# Patient Record
Sex: Male | Born: 1960
Health system: Southern US, Community
[De-identification: ages and names within clinical notes are randomized; demographics above are authoritative.]

## PROBLEM LIST (undated history)

## (undated) DIAGNOSIS — C801 Malignant (primary) neoplasm, unspecified: Secondary | ICD-10-CM

## (undated) DIAGNOSIS — J189 Pneumonia, unspecified organism: Secondary | ICD-10-CM

## (undated) DIAGNOSIS — K219 Gastro-esophageal reflux disease without esophagitis: Secondary | ICD-10-CM

## (undated) DIAGNOSIS — J449 Chronic obstructive pulmonary disease, unspecified: Secondary | ICD-10-CM

## (undated) DIAGNOSIS — E785 Hyperlipidemia, unspecified: Secondary | ICD-10-CM

## (undated) DIAGNOSIS — I10 Essential (primary) hypertension: Secondary | ICD-10-CM

## (undated) HISTORY — PX: NASAL SINUS SURGERY: SHX719

## (undated) HISTORY — DX: Chronic obstructive pulmonary disease, unspecified: J44.9

## (undated) HISTORY — DX: Hyperlipidemia, unspecified: E78.5

## (undated) HISTORY — PX: NASAL SEPTUM SURGERY: SHX37

---

## 2013-05-15 ENCOUNTER — Ambulatory Visit: Payer: Self-pay | Admitting: Family Medicine

## 2013-06-22 ENCOUNTER — Ambulatory Visit: Payer: Self-pay | Admitting: Otolaryngology

## 2013-08-24 DIAGNOSIS — J3489 Other specified disorders of nose and nasal sinuses: Secondary | ICD-10-CM | POA: Insufficient documentation

## 2013-08-24 DIAGNOSIS — J339 Nasal polyp, unspecified: Secondary | ICD-10-CM | POA: Insufficient documentation

## 2014-07-17 ENCOUNTER — Encounter: Payer: Self-pay | Admitting: Urgent Care

## 2014-07-17 ENCOUNTER — Emergency Department
Admission: EM | Admit: 2014-07-17 | Discharge: 2014-07-17 | Disposition: A | Discharge status: Home / Self Care | Payer: Managed Care, Other (non HMO) | Attending: Student | Admitting: Student

## 2014-07-17 DIAGNOSIS — Z720 Tobacco use: Secondary | ICD-10-CM | POA: Insufficient documentation

## 2014-07-17 DIAGNOSIS — Y9289 Other specified places as the place of occurrence of the external cause: Secondary | ICD-10-CM | POA: Insufficient documentation

## 2014-07-17 DIAGNOSIS — Y9389 Activity, other specified: Secondary | ICD-10-CM | POA: Insufficient documentation

## 2014-07-17 DIAGNOSIS — S61412A Laceration without foreign body of left hand, initial encounter: Secondary | ICD-10-CM | POA: Diagnosis not present

## 2014-07-17 DIAGNOSIS — W260XXA Contact with knife, initial encounter: Secondary | ICD-10-CM | POA: Insufficient documentation

## 2014-07-17 DIAGNOSIS — Y998 Other external cause status: Secondary | ICD-10-CM | POA: Insufficient documentation

## 2014-07-17 DIAGNOSIS — S61512A Laceration without foreign body of left wrist, initial encounter: Secondary | ICD-10-CM | POA: Diagnosis not present

## 2014-07-17 MED ORDER — LIDOCAINE-EPINEPHRINE (PF) 1 %-1:200000 IJ SOLN
INTRAMUSCULAR | Status: AC
  Filled 2014-07-17: qty 30

## 2014-07-17 MED ORDER — LIDOCAINE HCL (PF) 1 % IJ SOLN
INTRAMUSCULAR | Status: AC
  Filled 2014-07-17: qty 5

## 2014-07-17 MED ORDER — TETANUS-DIPHTH-ACELL PERTUSSIS 5-2.5-18.5 LF-MCG/0.5 IM SUSP
0.5000 mL | Freq: Once | INTRAMUSCULAR | Status: AC
  Administered 2014-07-17: 0.5 mL via INTRAMUSCULAR

## 2014-07-17 MED ORDER — IBUPROFEN 800 MG PO TABS: 800.0000 mg | ORAL_TABLET | Freq: Three times a day (TID) | ORAL | Status: DC | PRN

## 2014-07-17 MED ORDER — TETANUS-DIPHTH-ACELL PERTUSSIS 5-2.5-18.5 LF-MCG/0.5 IM SUSP
INTRAMUSCULAR | Status: AC
  Filled 2014-07-17: qty 0.5

## 2014-07-17 MED ORDER — HYDROCODONE-ACETAMINOPHEN 5-325 MG PO TABS: 1.0000 | ORAL_TABLET | ORAL | Status: DC | PRN

## 2014-07-17 NOTE — ED Notes (Signed)
Temporary pressure dressing applied in triage and secured with roll gauze. Wound is approximately 1" long and approximates well. (+) active bleeding noted.

## 2014-07-17 NOTE — ED Provider Notes (Signed)
[facility] Emergency Department Provider Note ____________________________________________  Time seen: Approximately 10:19 PM  I have reviewed the triage vital signs and the nursing notes.   HISTORY  Chief Complaint Laceration    HPI Kenneth Norman is a 54 y.o. male who presents to the emergency room for evaluation of laceration to his left hand and wrist. Patient states he was opening up some hamburger patties when the knife slipped and cut his hand. Tetanus unknown.   History reviewed. No pertinent past medical history.  There are no active problems to display for this patient.   Past Surgical History  Procedure Laterality Date  . Nasal sinus surgery      Current Outpatient Rx  Name  Route  Sig  Dispense  Refill  . HYDROcodone-acetaminophen (NORCO) 5-325 MG per tablet   Oral   Take 1-2 tablets by mouth every 4 (four) hours as needed for moderate pain.   10 tablet   0   . ibuprofen (ADVIL,MOTRIN) 800 MG tablet   Oral   Take 1 tablet (800 mg total) by mouth every 8 (eight) hours as needed.   30 tablet   0     Allergies Review of patient's allergies indicates no known allergies.  No family history on file.  Social History History  Substance Use Topics  . Smoking status: Current Every Day Smoker  . Smokeless tobacco: Not on file  . Alcohol Use: Yes    Review of Systems Constitutional: No fever/chills Eyes: No visual changes. ENT: No sore throat. Cardiovascular: Denies chest pain. Respiratory: Denies shortness of breath. Gastrointestinal: No abdominal pain.  No nausea, no vomiting.  No diarrhea.  No constipation. Genitourinary: Negative for dysuria. Musculoskeletal: Negative for back pain. Skin: Positive laceration to the left hand and wrist. Neurological: Negative for headaches, focal weakness or numbness.  10-point ROS otherwise negative.  ____________________________________________   PHYSICAL EXAM:  VITAL  SIGNS: ED Triage Vitals  Enc Vitals Group     BP 07/17/14 1948 120/85 mmHg     Pulse Rate 07/17/14 1948 80     Resp 07/17/14 1948 18     Temp 07/17/14 1948 98.6 F (37 C)     Temp Source 07/17/14 1948 Oral     SpO2 07/17/14 1948 98 %     Weight 07/17/14 1948 180 lb (81.647 kg)     Height 07/17/14 1948 5\' 10"  (1.778 m)     Head Cir --      Peak Flow --      Pain Score 07/17/14 1951 4     Pain Loc --      Pain Edu? --      Excl. in [place]? --     Constitutional: Alert and oriented. Well appearing and in no acute distress..   Cardiovascular: Normal rate, regular rhythm. Grossly normal heart sounds.  Good peripheral circulation. Respiratory: Normal respiratory effort.  No retractions. Lungs CTAB. Gastrointestinal: Soft and nontender. No distention. No abdominal bruits. No CVA tenderness. Musculoskeletal: No lower extremity tenderness nor edema.  No joint effusions. Neurologic:  Normal speech and language. No gross focal neurologic deficits are appreciated. Speech is normal. No gait instability. Skin:  3.0 cm laceration to left hand and 0.6 cm laceration to left wrist. No tendon involvement. Psychiatric: Mood and affect are normal. Speech and behavior are normal.  ____________________________________________   LABS (all labs ordered are listed, but only abnormal results are displayed)  Labs Reviewed - No data to display ____________________________________________  EKG  Deferred ____________________________________________  RADIOLOGY  Not applicable ____________________________________________   PROCEDURES  Procedure(s) performed: See Procedure note:  LACERATION REPAIR Performed by: Arlyss Repress Authorized by: Arlyss Repress Consent: Verbal consent obtained. Risks and benefits: risks, benefits and alternatives were discussed Consent given by: patient Patient identity confirmed: provided demographic data Prepped and Draped in normal sterile fashion Wound  explored  Laceration Location: Left hand/wrist  Laceration Length: 3.0cm and 0.5cm  No Foreign Bodies seen or palpated  Anesthesia: local infiltration  Local anesthetic: lidocaine 1% without epinephrine  Anesthetic total: 6 ml  Irrigation method: syringe Amount of cleaning: standard  Skin closure: 4-0 Nylon  Number of sutures: Hand: 5; Wrist:3  Technique: Simple interrupted  Patient tolerance: Patient tolerated the procedure well with no immediate complications.  Critical Care performed: No  ____________________________________________   INITIAL IMPRESSION / ASSESSMENT AND PLAN / ED COURSE  Pertinent labs & imaging results that were available during my care of the patient were reviewed by me and considered in my medical decision making (see chart for details).  Laceration to left hand and wrist. Procedure documented above. Wound was dressed and bandaged by myself after procedure. Distally neurovascularly intact. Tetanus updated. Patient to return for wound check in 2 days and suture removal in one week.  Patient denies any other emergency medical complaints at this visit and will follow-up or return with worsening symptomology or otherwise as directed. ____________________________________________   FINAL CLINICAL IMPRESSION(S) / ED DIAGNOSES  Final diagnoses:  Hand laceration, left, initial encounter      Arlyss Repress, PA-C 07/17/14 Rowlett Gayle, MD 07/18/14 [id number]

## 2014-07-17 NOTE — Discharge Instructions (Signed)
Laceration Care, Adult °A laceration is a cut or lesion that goes through all layers of the skin and into the tissue just beneath the skin. °TREATMENT  °Some lacerations may not require closure. Some lacerations may not be able to be closed due to an increased risk of infection. It is important to see your caregiver as soon as possible after an injury to minimize the risk of infection and maximize the opportunity for successful closure. °If closure is appropriate, pain medicines may be given, if needed. The wound will be cleaned to help prevent infection. Your caregiver will use stitches (sutures), staples, wound glue (adhesive), or skin adhesive strips to repair the laceration. These tools bring the skin edges together to allow for faster healing and a better cosmetic outcome. However, all wounds will heal with a scar. Once the wound has healed, scarring can be minimized by covering the wound with sunscreen during the day for 1 full year. °HOME CARE INSTRUCTIONS  °For sutures or staples: °· Keep the wound clean and dry. °· If you were given a bandage (dressing), you should change it at least once a day. Also, change the dressing if it becomes wet or dirty, or as directed by your caregiver. °· Wash the wound with soap and water 2 times a day. Rinse the wound off with water to remove all soap. Pat the wound dry with a clean towel. °· After cleaning, apply a thin layer of the antibiotic ointment as recommended by your caregiver. This will help prevent infection and keep the dressing from sticking. °· You may shower as usual after the first 24 hours. Do not soak the wound in water until the sutures are removed. °· Only take over-the-counter or prescription medicines for pain, discomfort, or fever as directed by your caregiver. °· Get your sutures or staples removed as directed by your caregiver. °For skin adhesive strips: °· Keep the wound clean and dry. °· Do not get the skin adhesive strips wet. You may bathe  carefully, using caution to keep the wound dry. °· If the wound gets wet, pat it dry with a clean towel. °· Skin adhesive strips will fall off on their own. You may trim the strips as the wound heals. Do not remove skin adhesive strips that are still stuck to the wound. They will fall off in time. °For wound adhesive: °· You may briefly wet your wound in the shower or bath. Do not soak or scrub the wound. Do not swim. Avoid periods of heavy perspiration until the skin adhesive has fallen off on its own. After showering or bathing, gently pat the wound dry with a clean towel. °· Do not apply liquid medicine, cream medicine, or ointment medicine to your wound while the skin adhesive is in place. This may loosen the film before your wound is healed. °· If a dressing is placed over the wound, be careful not to apply tape directly over the skin adhesive. This may cause the adhesive to be pulled off before the wound is healed. °· Avoid prolonged exposure to sunlight or tanning lamps while the skin adhesive is in place. Exposure to ultraviolet light in the first year will darken the scar. °· The skin adhesive will usually remain in place for 5 to 10 days, then naturally fall off the skin. Do not pick at the adhesive film. °You may need a tetanus shot if: °· You cannot remember when you had your last tetanus shot. °· You have never had a tetanus   shot. °If you get a tetanus shot, your arm may swell, get red, and feel warm to the touch. This is common and not a problem. If you need a tetanus shot and you choose not to have one, there is a rare chance of getting tetanus. Sickness from tetanus can be serious. °SEEK MEDICAL CARE IF:  °· You have redness, swelling, or increasing pain in the wound. °· You see a red line that goes away from the wound. °· You have yellowish-white fluid (pus) coming from the wound. °· You have a fever. °· You notice a bad smell coming from the wound or dressing. °· Your wound breaks open before or  after sutures have been removed. °· You notice something coming out of the wound such as wood or glass. °· Your wound is on your hand or foot and you cannot move a finger or toe. °SEEK IMMEDIATE MEDICAL CARE IF:  °· Your pain is not controlled with prescribed medicine. °· You have severe swelling around the wound causing pain and numbness or a change in color in your arm, hand, leg, or foot. °· Your wound splits open and starts bleeding. °· You have worsening numbness, weakness, or loss of function of any joint around or beyond the wound. °· You develop painful lumps near the wound or on the skin anywhere on your body. °MAKE SURE YOU:  °· Understand these instructions. °· Will watch your condition. °· Will get help right away if you are not doing well or get worse. °Document Released: 01/11/2005 Document Revised: 04/05/2011 Document Reviewed: 07/07/2010 °Exit[employer]® Patient Information ©2015 [employer]. This information is not intended to replace advice given to you by your health care provider. Make sure you discuss any questions you have with your health care provider. ° °Sutured Wound Care °Sutures are stitches that can be used to close wounds. Wound care helps prevent pain and infection.  °HOME CARE INSTRUCTIONS  °· Rest and elevate the injured area until all the pain and swelling are gone. °· Only take over-the-counter or prescription medicines for pain, discomfort, or fever as directed by your caregiver. °· After 48 hours, gently wash the area with mild soap and water once a day, or as directed. Rinse off the soap. Pat the area dry with a clean towel. Do not rub the wound. This may cause bleeding. °· Follow your caregiver's instructions for how often to change the bandage (dressing). Stop using a dressing after 2 days or after the wound stops draining. °· If the dressing sticks, moisten it with soapy water and gently remove it. °· Apply ointment on the wound as directed. °· Avoid stretching a sutured  wound. °· Drink enough fluids to keep your urine clear or pale yellow. °· Follow up with your caregiver for suture removal as directed. °· Use sunscreen on your wound for the next 3 to 6 months so the scar will not darken. °SEEK IMMEDIATE MEDICAL CARE IF:  °· Your wound becomes red, swollen, hot, or tender. °· You have increasing pain in the wound. °· You have a red streak that extends from the wound. °· There is pus coming from the wound. °· You have a fever. °· You have shaking chills. °· There is a bad smell coming from the wound. °· You have persistent bleeding from the wound. °MAKE SURE YOU:  °· Understand these instructions. °· Will watch your condition. °· Will get help right away if you are not doing well or get worse. °Document Released:   02/19/2004 Document Revised: 04/05/2011 Document Reviewed: 05/17/2010 °Exit[employer]® Patient Information ©2015 [employer]. This information is not intended to replace advice given to you by your health care provider. Make sure you discuss any questions you have with your health care provider. ° °

## 2014-07-17 NOTE — ED Notes (Signed)
First nurse note: pt arrived with laceration to left hand. Cut with "very sharp" knife while separating frozen meat.

## 2014-07-17 NOTE — ED Notes (Signed)
Patient presents with laceration to the palmar surface of his LEFT hand. Patient reporting that he was "separating two frozen hamburger patties and the knife slipped."

## 2014-07-19 ENCOUNTER — Encounter: Payer: Self-pay | Admitting: Medical Oncology

## 2014-07-19 ENCOUNTER — Emergency Department
Admission: EM | Admit: 2014-07-19 | Discharge: 2014-07-19 | Disposition: A | Discharge status: Home / Self Care | Payer: Managed Care, Other (non HMO) | Attending: Emergency Medicine | Admitting: Emergency Medicine

## 2014-07-19 DIAGNOSIS — Z4801 Encounter for change or removal of surgical wound dressing: Secondary | ICD-10-CM | POA: Insufficient documentation

## 2014-07-19 DIAGNOSIS — Z720 Tobacco use: Secondary | ICD-10-CM | POA: Diagnosis not present

## 2014-07-19 DIAGNOSIS — Z5189 Encounter for other specified aftercare: Secondary | ICD-10-CM

## 2014-07-19 NOTE — ED Notes (Signed)
MD at bedside. 

## 2014-07-19 NOTE — Discharge Instructions (Signed)

## 2014-07-19 NOTE — ED Notes (Signed)
Pt here for left hand re-check. Had sutures placed Thursday night.

## 2014-07-19 NOTE — ED Provider Notes (Signed)
[facility] Emergency Department Provider Note  ____________________________________________  Time seen: Approximately 10:52 AM  I have reviewed the triage vital signs and the nursing notes.   HISTORY  Chief Complaint Follow-up     HPI Kenneth Norman is a 54 y.o. male presents for today evaluation postop laceration. Patient denies any complaints at today's visit states that he is doing well. He's had one dressing change and denies any discharge or drainage. Denies any pain.   History reviewed. No pertinent past medical history.  There are no active problems to display for this patient.   Past Surgical History  Procedure Laterality Date  . Nasal sinus surgery      Current Outpatient Rx  Name  Route  Sig  Dispense  Refill  . HYDROcodone-acetaminophen (NORCO) 5-325 MG per tablet   Oral   Take 1-2 tablets by mouth every 4 (four) hours as needed for moderate pain.   10 tablet   0   . ibuprofen (ADVIL,MOTRIN) 800 MG tablet   Oral   Take 1 tablet (800 mg total) by mouth every 8 (eight) hours as needed.   30 tablet   0     Allergies Review of patient's allergies indicates no known allergies.  No family history on file.  Social History History  Substance Use Topics  . Smoking status: Current Every Day Smoker  . Smokeless tobacco: Not on file  . Alcohol Use: Yes    Review of Systems Constitutional: No fever/chills Eyes: No visual changes. ENT: No sore throat. Cardiovascular: Denies chest pain. Respiratory: Denies shortness of breath. Gastrointestinal: No abdominal pain.  No nausea, no vomiting.  No diarrhea.  No constipation. Genitourinary: Negative for dysuria. Musculoskeletal: Negative for back pain. Skin: Positive for laceration wound check. Neurological: Negative for headaches, focal weakness or numbness.  10-point ROS otherwise negative.  ____________________________________________   PHYSICAL EXAM:  VITAL SIGNS: ED  Triage Vitals  Enc Vitals Group     BP 07/19/14 1010 133/85 mmHg     Pulse Rate 07/19/14 1010 96     Resp 07/19/14 1010 18     Temp 07/19/14 1010 97.7 F (36.5 C)     Temp src --      SpO2 07/19/14 1010 97 %     Weight 07/19/14 1010 180 lb (81.647 kg)     Height 07/19/14 1010 5\' 10"  (1.778 m)     Head Cir --      Peak Flow --      Pain Score 07/19/14 1035 0     Pain Loc --      Pain Edu? --      Excl. in [place]? --     Constitutional: Alert and oriented. Well appearing and in no acute distress. Skin:  Laceration as well healing edges are approximated. No evidence of erythema or drainage. Psychiatric: Mood and affect are normal. Speech and behavior are normal.  ____________________________________________   LABS (all labs ordered are listed, but only abnormal results are displayed)  Labs Reviewed - No data to display ____________________________________________   PROCEDURES  Procedure(s) performed: None  Critical Care performed: No  ____________________________________________   INITIAL IMPRESSION / ASSESSMENT AND PLAN / ED COURSE  Pertinent labs & imaging results that were available during my care of the patient were reviewed by me and considered in my medical decision making (see chart for details).  48 hour wound check for laceration performed by myself. No erythema edema or drainage. Laceration is healing well. Patient  return to ER 1 week for suture removal.  Patient voices no other emergency medical complaints at this visit. ____________________________________________   FINAL CLINICAL IMPRESSION(S) / ED DIAGNOSES  Final diagnoses:  Visit for wound check      Arlyss Repress, PA-C 07/19/14 1057  Lavonia Drafts, MD 07/19/14 [phone]

## 2014-07-28 ENCOUNTER — Emergency Department
Admission: EM | Admit: 2014-07-28 | Discharge: 2014-07-28 | Disposition: A | Discharge status: Home / Self Care | Payer: Managed Care, Other (non HMO) | Attending: Emergency Medicine | Admitting: Emergency Medicine

## 2014-07-28 ENCOUNTER — Encounter: Payer: Self-pay | Admitting: Emergency Medicine

## 2014-07-28 DIAGNOSIS — Z4802 Encounter for removal of sutures: Secondary | ICD-10-CM | POA: Diagnosis not present

## 2014-07-28 DIAGNOSIS — Z720 Tobacco use: Secondary | ICD-10-CM | POA: Diagnosis not present

## 2014-07-28 MED ORDER — BACITRACIN ZINC 500 UNIT/GM EX OINT
TOPICAL_OINTMENT | CUTANEOUS | Status: AC
  Administered 2014-07-28: 1 via TOPICAL
  Filled 2014-07-28: qty 0.9

## 2014-07-28 MED ORDER — BACITRACIN 500 UNIT/GM EX OINT
1.0000 "application " | TOPICAL_OINTMENT | Freq: Two times a day (BID) | CUTANEOUS | Status: DC
  Administered 2014-07-28: 1 via TOPICAL

## 2014-07-28 NOTE — Discharge Instructions (Signed)
Keep clean and dry. Clean with soap and water. Apply topical antibiotic ointment daily such as neosporin. Keep covered at work.   Follow up with your primary care physician as needed.  Return to ER for new or worsening concerns.   Suture Removal, Care After Refer to this sheet in the next few weeks. These instructions provide you with information on caring for yourself after your procedure. Your health care provider may also give you more specific instructions. Your treatment has been planned according to current medical practices, but problems sometimes occur. Call your health care provider if you have any problems or questions after your procedure. WHAT TO EXPECT AFTER THE PROCEDURE After your stitches (sutures) are removed, it is typical to have the following:  Some discomfort and swelling in the wound area.  Slight redness in the area. HOME CARE INSTRUCTIONS   If you have skin adhesive strips over the wound area, do not take the strips off. They will fall off on their own in a few days. If the strips remain in place after 14 days, you may remove them.  Change any bandages (dressings) at least once a day or as directed by your health care provider. If the bandage sticks, soak it off with warm, soapy water.  Apply cream or ointment only as directed by your health care provider. If using cream or ointment, wash the area with soap and water 2 times a day to remove all the cream or ointment. Rinse off the soap and pat the area dry with a clean towel.  Keep the wound area dry and clean. If the bandage becomes wet or dirty, or if it develops a bad smell, change it as soon as possible.  Continue to protect the wound from injury.  Use sunscreen when out in the sun. New scars become sunburned easily. SEEK MEDICAL CARE IF:  You have increasing redness, swelling, or pain in the wound.  You see pus coming from the wound.  You have a fever.  You notice a bad smell coming from the wound or  dressing.  Your wound breaks open (edges not staying together). Document Released: 10/06/2000 Document Revised: 11/01/2012 Document Reviewed: 08/23/2012 [facility] Patient Information 2015 [place], [place]. This information is not intended to replace advice given to you by your health care provider. Make sure you discuss any questions you have with your health care provider.

## 2014-07-28 NOTE — ED Notes (Signed)
Bacitracin and bandaid applied as ordered

## 2014-07-28 NOTE — ED Notes (Signed)
9 sutures removed total - 6 in palm and 3 in wrist

## 2014-07-28 NOTE — ED Provider Notes (Signed)
[facility] Emergency Department Provider Note  ____________________________________________  Time seen: Approximately 12:25 PM  I have reviewed the triage vital signs and the nursing notes.   HISTORY  Chief Complaint Suture / Staple Removal   HPI ADYAN PALAU is a 54 y.o. male presents to the ER for complaint of suture removal. Patient reports that he was seen in the ER06/22/2016 post accidentally cutting his left hand and left wrist with a knife while cutting open a frozen hamburger patties. Patient reports that he is here for suture removal today. Denies complaints. Reports that healing well. Denies pain, fever, numbness or tingling, decreased sensation. 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 Again denies pain or other complaints.   History reviewed. No pertinent past medical history.  There are no active problems to display for this patient.   Past Surgical History  Procedure Laterality Date  . Nasal sinus surgery      Current Outpatient Rx  Name  Route  Sig  Dispense  Refill  . HYDROcodone-acetaminophen (NORCO) 5-325 MG per tablet   Oral   Take 1-2 tablets by mouth every 4 (four) hours as needed for moderate pain.   10 tablet   0   . ibuprofen (ADVIL,MOTRIN) 800 MG tablet   Oral   Take 1 tablet (800 mg total) by mouth every 8 (eight) hours as needed.   30 tablet   0     Allergies Review of patient's allergies indicates no known allergies.  No family history on file.  Social History History  Substance Use Topics  . Smoking status: Current Every Day Smoker  . Smokeless tobacco: Not on file  . Alcohol Use: Yes     Comment: occasional    Review of Systems Constitutional: No fever/chills Eyes: No visual changes. ENT: No sore throat. Cardiovascular: Denies chest pain. Respiratory: Denies shortness of breath. Gastrointestinal: No abdominal pain.  No nausea, no  vomiting.  No diarrhea.  No constipation. Genitourinary: Negative for dysuria. Musculoskeletal: Negative for back pain. Skin: Negative for rash. Neurological: Negative for headaches, focal weakness or numbness.  10-point ROS otherwise negative.  Reports tetanus up-to-date __________________________________________   PHYSICAL EXAM:  VITAL SIGNS: ED Triage Vitals  Enc Vitals Group     BP 07/28/14 1136 132/71 mmHg     Pulse Rate 07/28/14 1136 74     Resp 07/28/14 1136 16     Temp 07/28/14 1136 97.6 F (36.4 C)     Temp Source 07/28/14 1136 Oral     SpO2 07/28/14 1136 96 %     Weight 07/28/14 1136 180 lb (81.647 kg)     Height 07/28/14 1136 5\' 10"  (1.778 m)     Head Cir --      Peak Flow --      Pain Score --      Pain Loc --      Pain Edu? --      Excl. in [place]? --     Constitutional: Alert and oriented. Well appearing and in no acute distress. Eyes: Conjunctivae are normal. PERRL. EOMI. Head: Atraumatic. Nose: No congestion/rhinnorhea. Mouth/Throat: Mucous membranes are moist.   Neck: No stridor.  No cervical spine tenderness to palpation. Cardiovascular: Normal rate, regular rhythm. Grossly normal heart sounds.  Good peripheral circulation. Respiratory: Normal respiratory effort.  No retractions. Lungs CTAB. Gastrointestinal: Soft and nontender. No distention. Musculoskeletal: No lower extremity tenderness nor edema.  No joint  effusions. Neurologic:  Normal speech and language. No gross focal neurologic deficits are appreciated. Speech is normal. No gait instability. Skin:  Skin is warm, dry and intact. No rash noted.except: sutures removed by RN prior to examination. Healing lacerations to left pain and left wrist. Well approximated. No erythema, drainage, fluctuance.  Psychiatric: Mood and affect are normal. Speech and behavior are normal.  ____________________________________________ PROCEDURE: SUTURE REMOVAL Performed by: RN susan Consent: Verbal consent  obtained. Patient identity confirmed: provided demographic data Time out: Immediately prior to procedure a "time out" was called to verify the correct patient, procedure, equipment, support staff and site/side marked as required.  Location details: left hand  Wound Appearance: clean  Sutures/Staples Removed: 9  Facility: sutures placed in this facility Patient tolerance: Patient tolerated the procedure well with no immediate complications.    INITIAL IMPRESSION / ASSESSMENT AND PLAN / ED COURSE  Pertinent labs & imaging results that were available during my care of the patient were reviewed by me and considered in my medical decision making (see chart for details).  Patient presents for suture removal. The wound is well healed without signs of infection.  The sutures are removed. Wound care and activity instructions given. Return prn. ____________________________________________   FINAL CLINICAL IMPRESSION(S) / ED DIAGNOSES  Final diagnoses:  Visit for suture removal      Marylene Land, NP 07/28/14 [place], MD 07/28/14 1452

## 2014-07-28 NOTE — ED Notes (Signed)
Patient presents to have sutures removed from left hand. Denies any drainage from wounds

## 2015-03-13 DIAGNOSIS — Z720 Tobacco use: Secondary | ICD-10-CM | POA: Insufficient documentation

## 2015-05-26 DIAGNOSIS — I10 Essential (primary) hypertension: Secondary | ICD-10-CM | POA: Insufficient documentation

## 2015-10-10 IMAGING — CT CT PARANASAL SINUSES W/O CM
3 series · 15 of 47 positions shown, 18 images · non-contrast
Comparison: 05/15/2013 sinus CT.

CLINICAL DATA: Chronic sinusitis.  Presurgical exam.

EXAM:
CT PARANASAL SINUS WITHOUT CONTRAST
TECHNIQUE: Multidetector CT images of the paranasal sinuses were obtained using
the standard protocol without intravenous contrast.

[Series 3: ax soft · axial · 0.34mm/px · z∈[-104,+38]mm · 9 of 167 slices shown, 12 images]
[im 12/167  brain]
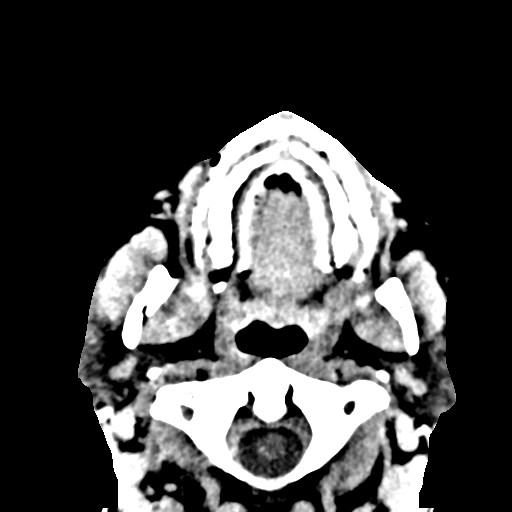
[im 12/167  bone]
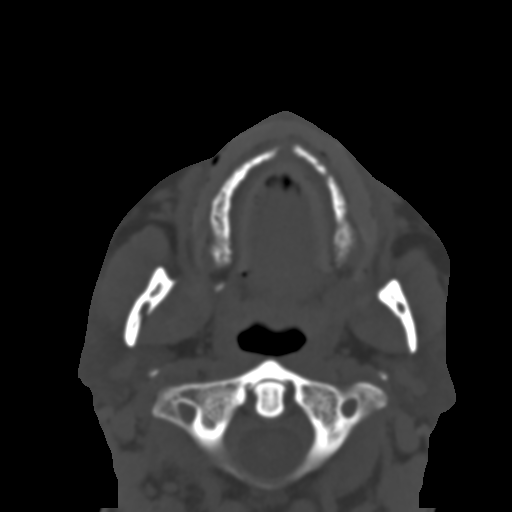
[im 29/167  bone]
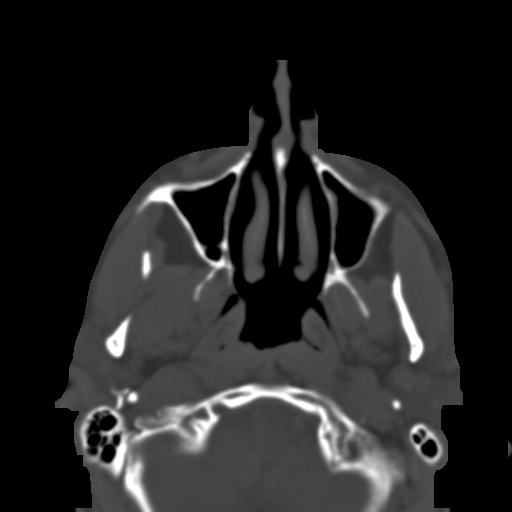
[im 46/167  bone]
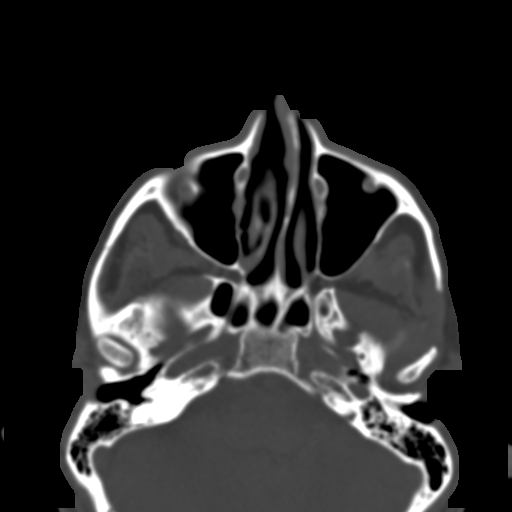
[im 63/167  bone]
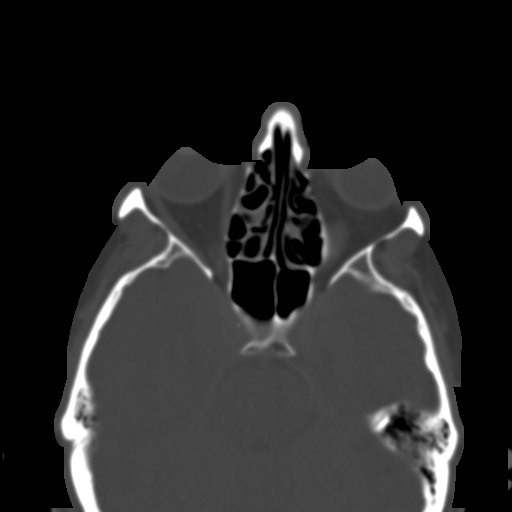
[im 86/167  brain]
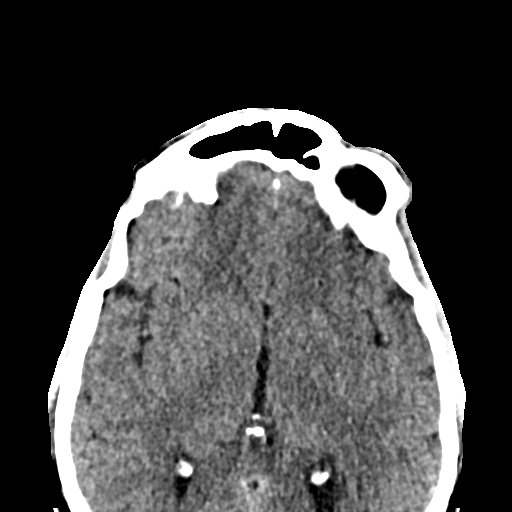
[im 86/167  bone]
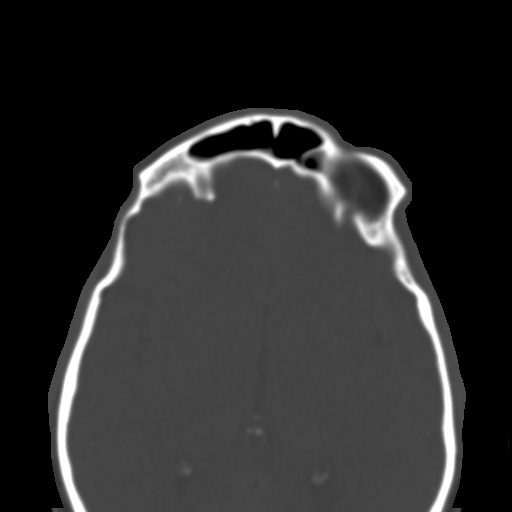
[im 104/167  bone]
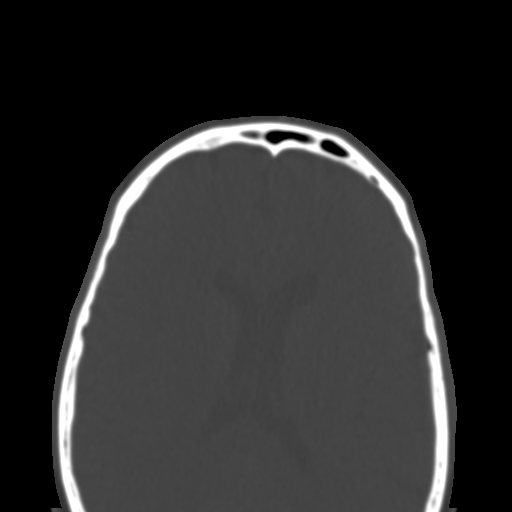
[im 121/167  bone]
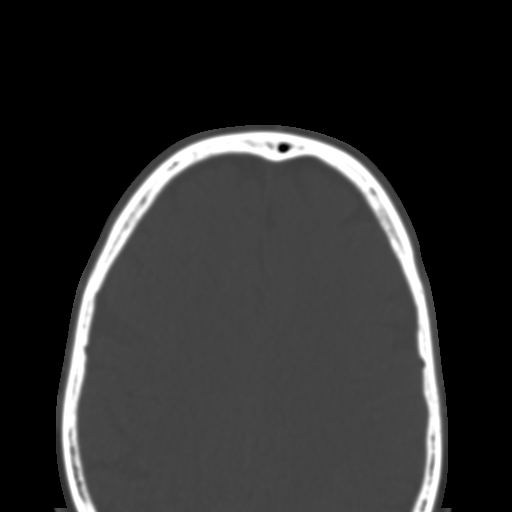
[im 138/167  bone]
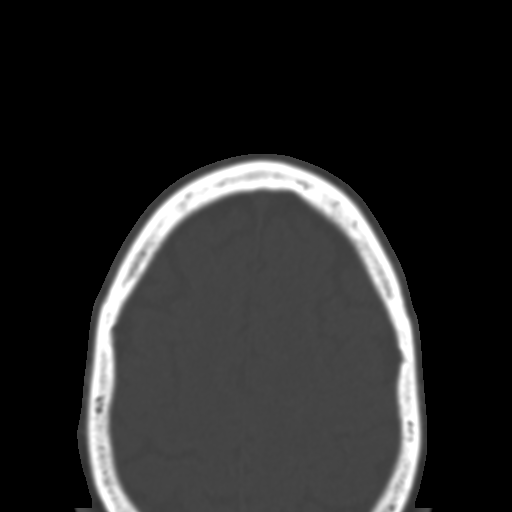
[im 155/167  brain]
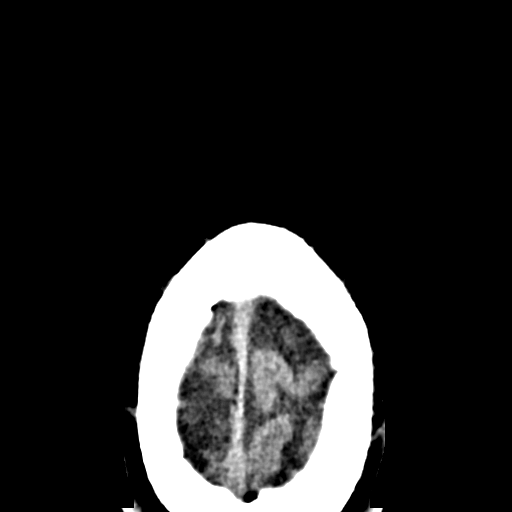
[im 155/167  bone]
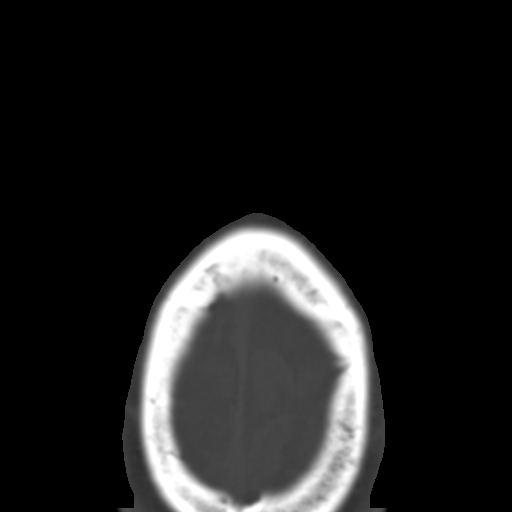

[Series 4: coronal bone · coronal · 0.34mm/px · 3 of 81 slices shown]
[im 27/81  bone]
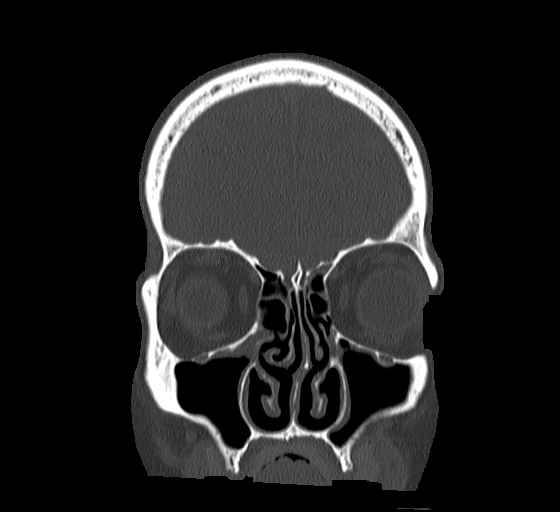
[im 36/81  bone]
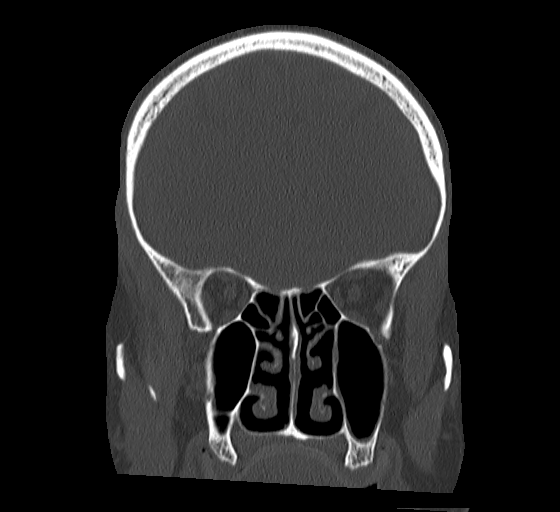
[im 45/81  bone]
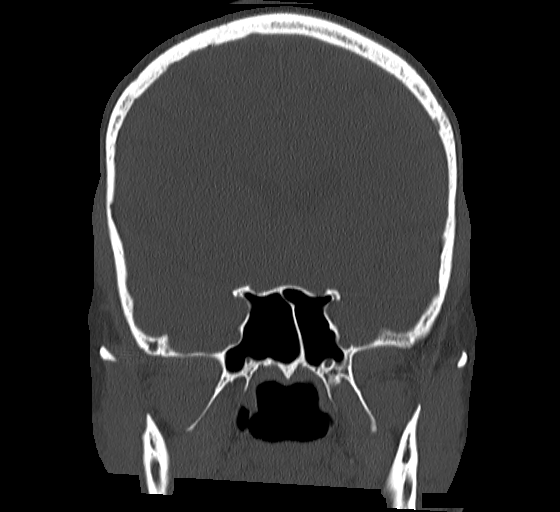

[Series 5: sagittal bone · sagittal · 0.34mm/px · 3 of 90 slices shown]
[im 30/90  bone]
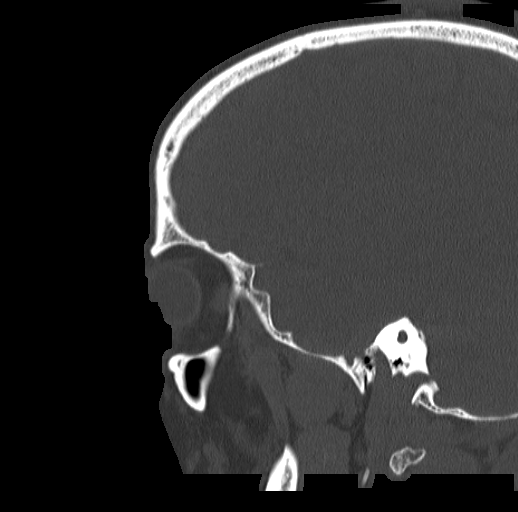
[im 45/90  bone]
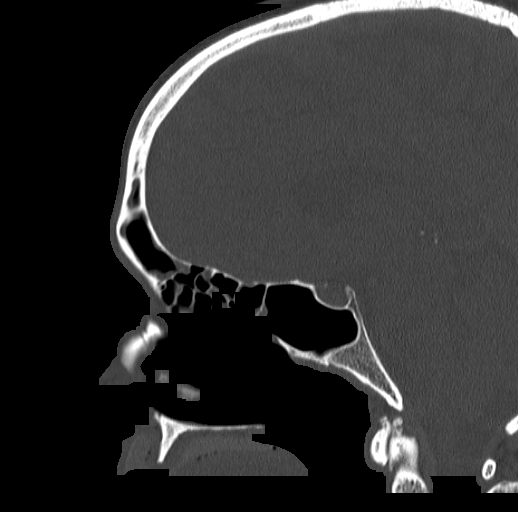
[im 60/90  bone]
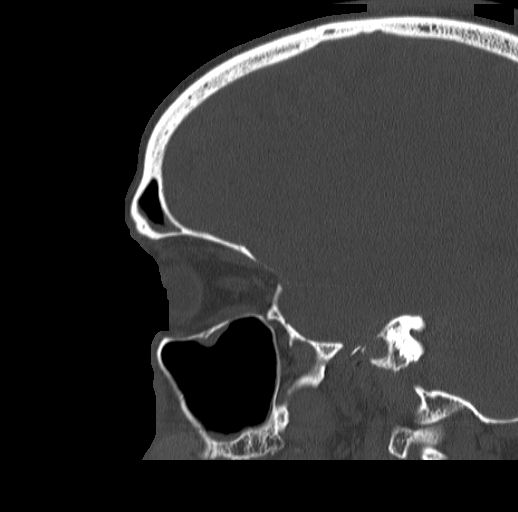

[15 of 47 positions shown; findings below may reference images not displayed]

FINDINGS: Minimal to mild mucosal thickening frontal sinuses more notable on
the left.

Minimal to mild mucosal thickening ethmoid sinus air cells improved
since prior exam.

Mild mucosal thickening maxillary sinuses most notable superior
aspect of the maxillary sinus adjacent to the infundibulum.
Infundibulum opacified bilaterally. Small left Haller cell
contributes to narrowing of the left infundibulum. Degree of
opacification of the right maxillary sinus has improved when
compared to prior exam.

Very minimal mucosal thickening anterior aspect sphenoid sinus air
cells. Right sphenoid sinus air cell is dominant. The septum between
the sphenoid sinus air cells is directed to the anterior aspect of
the left carotid canal. Partial aeration of the clinoid bilaterally.

Cribriform plate slightly lower on the right.

Nasal septum deviated slightly to left.

Mastoid air cells and middle ear cavities are clear bilaterally.

Calcifications internal carotid artery cavernous segment
bilaterally. Remainder of visualized intracranial structures and
orbital structures are unremarkable.

Left parotid sub cm lesion suggestive of a small lymph node.
IMPRESSION: Minimal to mild mucosal thickening frontal sinuses more notable on
the left.

Minimal to mild mucosal thickening ethmoid sinus air cells improved
since prior exam.

Mild mucosal thickening maxillary sinuses most notable superior
aspect of the maxillary sinus adjacent to the infundibulum.
Infundibulum opacified bilaterally. Small left Haller cell
contributes to narrowing of the left infundibulum. Degree of
opacification of the right maxillary sinus has improved when
compared to prior exam.

Very minimal mucosal thickening anterior aspect sphenoid sinus air
cells. Right sphenoid sinus air cell is dominant. The septum between
the sphenoid sinus air cells is directed to the anterior aspect of
the left carotid canal. Partial aeration of the clinoid bilaterally.

Cribriform plate slightly lower on the right.

Nasal septum deviated slightly to left.

## 2015-11-22 DIAGNOSIS — J449 Chronic obstructive pulmonary disease, unspecified: Secondary | ICD-10-CM | POA: Insufficient documentation

## 2016-08-02 DIAGNOSIS — E1169 Type 2 diabetes mellitus with other specified complication: Secondary | ICD-10-CM | POA: Insufficient documentation

## 2016-08-02 DIAGNOSIS — E782 Mixed hyperlipidemia: Secondary | ICD-10-CM | POA: Insufficient documentation

## 2017-11-16 DIAGNOSIS — Z122 Encounter for screening for malignant neoplasm of respiratory organs: Secondary | ICD-10-CM | POA: Insufficient documentation

## 2018-08-08 ENCOUNTER — Other Ambulatory Visit: Payer: Self-pay

## 2018-08-08 ENCOUNTER — Encounter: Payer: Self-pay | Admitting: Adult Health

## 2018-08-08 ENCOUNTER — Ambulatory Visit: Payer: Commercial Managed Care - PPO | Admitting: Adult Health

## 2018-08-08 VITALS — BP 142/89 | HR 88 | Resp 16 | Ht 70.0 in | Wt 207.0 lb

## 2018-08-08 DIAGNOSIS — J309 Allergic rhinitis, unspecified: Secondary | ICD-10-CM | POA: Insufficient documentation

## 2018-08-08 DIAGNOSIS — E782 Mixed hyperlipidemia: Secondary | ICD-10-CM | POA: Diagnosis not present

## 2018-08-08 DIAGNOSIS — R635 Abnormal weight gain: Secondary | ICD-10-CM

## 2018-08-08 DIAGNOSIS — I10 Essential (primary) hypertension: Secondary | ICD-10-CM | POA: Diagnosis not present

## 2018-08-08 DIAGNOSIS — K4090 Unilateral inguinal hernia, without obstruction or gangrene, not specified as recurrent: Secondary | ICD-10-CM

## 2018-08-08 DIAGNOSIS — J438 Other emphysema: Secondary | ICD-10-CM

## 2018-08-08 MED ORDER — AMLODIPINE BESYLATE 5 MG PO TABS: 5.0000 mg | ORAL_TABLET | Freq: Every day | ORAL | 0 refills | Status: DC

## 2018-08-08 NOTE — Progress Notes (Signed)
[facility] [place], [place] [address]  Internal MEDICINE  Office Visit Note  Patient Name: Kenneth Norman  [id number]  [id number]  Date of Service: 08/24/2018   Complaints/HPI Pt is here for establishment of PCP. Chief Complaint  Patient presents with  . Medical Management of Chronic Issues    new patient establish care  . Hyperlipidemia  . COPD   HPI Pt is here to establish care. He is a well appearing 58 yo man.  He reports a history of HLD, and COPD. He states that his previous PCP informed him his blood pressure was elevated, but he did not follow up on it for leaving.  His bp is elevated today 142/89, he Denies Chest pain, Shortness of breath, palpitations, headache, or blurred vision. He reports smoking 1.5 PPD of cigarettes, drinking 1-2 alcoholic beverages daily, and denies illicit drug use. He also has a left inguinal hernia that has been present for awhile.  He denies any significant discomfort or issues.        Current Medication: Outpatient Encounter Medications as of 08/08/2018  Medication Sig  . atorvastatin (LIPITOR) 20 MG tablet Take 20 mg by mouth daily.  Marland Kitchen umeclidinium-vilanterol (ANORO ELLIPTA) 62.5-25 MCG/INH AEPB Inhale 1 puff into the lungs daily.  Marland Kitchen amLODipine (NORVASC) 5 MG tablet Take 1 tablet (5 mg total) by mouth daily.  . [DISCONTINUED] HYDROcodone-acetaminophen (NORCO) 5-325 MG per tablet Take 1-2 tablets by mouth every 4 (four) hours as needed for moderate pain.  . [DISCONTINUED] ibuprofen (ADVIL,MOTRIN) 800 MG tablet Take 1 tablet (800 mg total) by mouth every 8 (eight) hours as needed.   No facility-administered encounter medications on file as of 08/08/2018.     Surgical History: Past Surgical History:  Procedure Laterality Date  . NASAL SEPTUM SURGERY    . NASAL SINUS SURGERY      Medical History: Past Medical History:  Diagnosis Date  . COPD (chronic obstructive pulmonary disease) ([place])   . Hyperlipidemia      Family History: Family History  Problem Relation Age of Onset  . Cancer Mother   . Heart disease Father     Social History   Socioeconomic History  . Marital status: Married    Spouse name: Not on file  . Number of children: Not on file  . Years of education: Not on file  . Highest education level: Not on file  Occupational History  . Not on file  Social Needs  . Financial resource strain: Not on file  . Food insecurity    Worry: Not on file    Inability: Not on file  . Transportation needs    Medical: Not on file    Non-medical: Not on file  Tobacco Use  . Smoking status: Current Every Day Smoker  . Smokeless tobacco: Never Used  Substance and Sexual Activity  . Alcohol use: Yes    Comment: occasional  . Drug use: Never  . Sexual activity: Not on file  Lifestyle  . Physical activity    Days per week: Not on file    Minutes per session: Not on file  . Stress: Not on file  Relationships  . Social Herbalist on phone: Not on file    Gets together: Not on file    Attends religious service: Not on file    Active member of club or organization: Not on file    Attends meetings of clubs or organizations: Not on file  Relationship status: Not on file  . Intimate partner violence    Fear of current or ex partner: Not on file    Emotionally abused: Not on file    Physically abused: Not on file    Forced sexual activity: Not on file  Other Topics Concern  . Not on file  Social History Narrative  . Not on file     Review of Systems  Constitutional: Negative.  Negative for chills, fatigue and unexpected weight change.  HENT: Positive for facial swelling and postnasal drip. Negative for congestion, rhinorrhea, sneezing and sore throat.   Eyes: Negative for redness.  Respiratory: Negative.  Negative for cough, chest tightness and shortness of breath.   Cardiovascular: Negative.  Negative for chest pain and palpitations.  Gastrointestinal: Negative.   Negative for abdominal pain, constipation, diarrhea, nausea and vomiting.  Endocrine: Negative.   Genitourinary: Negative.  Negative for dysuria and frequency.  Musculoskeletal: Negative.  Negative for arthralgias, back pain, joint swelling and neck pain.  Skin: Negative.  Negative for rash.  Allergic/Immunologic: Negative.   Neurological: Negative.  Negative for tremors and numbness.  Hematological: Negative for adenopathy. Does not bruise/bleed easily.  Psychiatric/Behavioral: Negative.  Negative for behavioral problems, sleep disturbance and suicidal ideas. The patient is not nervous/anxious.     Vital Signs: BP (!) 142/89 (BP Location: Right Arm, Patient Position: Sitting, Cuff Size: Normal)   Pulse 88   Resp 16   Ht 5\' 10"  (1.778 m)   Wt 207 lb (93.9 kg)   SpO2 97%   BMI 29.70 kg/m    Physical Exam Vitals signs and nursing note reviewed.  Constitutional:      General: He is not in acute distress.    Appearance: He is well-developed. He is not diaphoretic.  HENT:     Head: Normocephalic and atraumatic.     Mouth/Throat:     Pharynx: No oropharyngeal exudate.  Eyes:     Pupils: Pupils are equal, round, and reactive to light.  Neck:     Musculoskeletal: Normal range of motion and neck supple.     Thyroid: No thyromegaly.     Vascular: No JVD.     Trachea: No tracheal deviation.  Cardiovascular:     Rate and Rhythm: Normal rate and regular rhythm.     Heart sounds: Normal heart sounds. No murmur. No friction rub. No gallop.   Pulmonary:     Effort: Pulmonary effort is normal. No respiratory distress.     Breath sounds: Normal breath sounds. No wheezing or rales.  Chest:     Chest wall: No tenderness.  Abdominal:     Palpations: Abdomen is soft.     Tenderness: There is no abdominal tenderness. There is no guarding.  Musculoskeletal: Normal range of motion.  Lymphadenopathy:     Cervical: No cervical adenopathy.  Skin:    General: Skin is warm and dry.   Neurological:     Mental Status: He is alert and oriented to person, place, and time.     Cranial Nerves: No cranial nerve deficit.  Psychiatric:        Behavior: Behavior normal.        Thought Content: Thought content normal.        Judgment: Judgment normal.    Assessment/Plan: 1. Other emphysema ([place]) Continue Anoro at this time.  Will discuss establishing care with pulmonary at next visit.  - umeclidinium-vilanterol (ANORO ELLIPTA) 62.5-25 MCG/INH AEPB; Inhale 1 puff into the lungs daily.  2. Essential hypertension Take low dose Norvasc as discussed.   - amLODipine (NORVASC) 5 MG tablet; Take 1 tablet (5 mg total) by mouth daily.  Dispense: 30 tablet; Refill: 0  3. Mixed hyperlipidemia - atorvastatin (LIPITOR) 20 MG tablet; Take 20 mg by mouth daily.  4. Inguinal hernia of left side without obstruction or gangrene Offered surgical consult, pt declined at this time and will think about it in the future. We discussed the possibility of the hernia becoming incarcerated and he verbalized understanding.   5. Weight gain - TSH + free T4  General Counseling: Kmarion verbalizes understanding of the findings of todays visit and agrees with plan of treatment. I have discussed any further diagnostic evaluation that may be needed or ordered today. We also reviewed his medications today. he has been encouraged to call the office with any questions or concerns that should arise related to todays visit.  Orders Placed This Encounter  Procedures  . TSH + free T4    Meds ordered this encounter  Medications  . amLODipine (NORVASC) 5 MG tablet    Sig: Take 1 tablet (5 mg total) by mouth daily.    Dispense:  30 tablet    Refill:  0    Time spent: 35 Minutes   This patient was seen by Orson Gear AGNP-C in Collaboration with Dr Lavera Guise as a part of collaborative care agreement  Kendell Bane AGNP-C Internal Medicine

## 2018-08-09 LAB — TSH+FREE T4
Free T4: 1.14 ng/dL (ref 0.82–1.77)
TSH: 2.44 u[IU]/mL (ref 0.450–4.500)

## 2018-08-31 ENCOUNTER — Encounter: Payer: Self-pay | Admitting: Adult Health

## 2018-09-04 ENCOUNTER — Other Ambulatory Visit: Payer: Self-pay

## 2018-09-04 ENCOUNTER — Ambulatory Visit: Payer: Commercial Managed Care - PPO | Admitting: Adult Health

## 2018-09-04 VITALS — Temp 100.0°F

## 2018-09-04 DIAGNOSIS — Z20828 Contact with and (suspected) exposure to other viral communicable diseases: Secondary | ICD-10-CM

## 2018-09-04 DIAGNOSIS — Z20822 Contact with and (suspected) exposure to covid-19: Secondary | ICD-10-CM

## 2018-09-04 DIAGNOSIS — I10 Essential (primary) hypertension: Secondary | ICD-10-CM

## 2018-09-04 NOTE — Progress Notes (Signed)
[facility] [place],  [address]  Internal MEDICINE  Telephone Visit  Patient Name: Kenneth Norman  [id number]  [id number]  Date of Service: 09/04/2018  I connected with the patient at [phone] by telephone and verified the patients identity using two identifiers.   I discussed the limitations, risks, security and privacy concerns of performing an evaluation and management service by telephone and the availability of in person appointments. I also discussed with the patient that there may be a patient responsible charge related to the service.  The patient expressed understanding and agrees to proceed.    Chief Complaint  Patient presents with  . Telephone Screen  . Fever  . Chills  . Migraine  . Cough  . Telephone Assessment    HPI  Pt reports yesterday he began to notice back and knee pains.  This morning he woke up with a fever 100.0, a worse than normal smokers cough, chills and headache.  He works for [employer], and was told he had to be tested to return to work.  He is currently taking Dayquil, and is feeling a little better.    Current Medication: Outpatient Encounter Medications as of 09/04/2018  Medication Sig  . amLODipine (NORVASC) 5 MG tablet Take 1 tablet (5 mg total) by mouth daily.  Marland Kitchen atorvastatin (LIPITOR) 20 MG tablet Take 20 mg by mouth daily.  Marland Kitchen umeclidinium-vilanterol (ANORO ELLIPTA) 62.5-25 MCG/INH AEPB Inhale 1 puff into the lungs daily.   No facility-administered encounter medications on file as of 09/04/2018.     Surgical History: Past Surgical History:  Procedure Laterality Date  . NASAL SEPTUM SURGERY    . NASAL SINUS SURGERY      Medical History: Past Medical History:  Diagnosis Date  . COPD (chronic obstructive pulmonary disease) ([place])   . Hyperlipidemia     Family History: Family History  Problem Relation Age of Onset  . Cancer Mother   . Heart disease Father     Social History   Socioeconomic  History  . Marital status: Married    Spouse name: Not on file  . Number of children: Not on file  . Years of education: Not on file  . Highest education level: Not on file  Occupational History  . Not on file  Social Needs  . Financial resource strain: Not on file  . Food insecurity    Worry: Not on file    Inability: Not on file  . Transportation needs    Medical: Not on file    Non-medical: Not on file  Tobacco Use  . Smoking status: Current Every Day Smoker  . Smokeless tobacco: Never Used  Substance and Sexual Activity  . Alcohol use: Yes    Comment: occasional  . Drug use: Never  . Sexual activity: Not on file  Lifestyle  . Physical activity    Days per week: Not on file    Minutes per session: Not on file  . Stress: Not on file  Relationships  . Social Herbalist on phone: Not on file    Gets together: Not on file    Attends religious service: Not on file    Active member of club or organization: Not on file    Attends meetings of clubs or organizations: Not on file    Relationship status: Not on file  . Intimate partner violence    Fear of current or ex partner: Not on file  Emotionally abused: Not on file    Physically abused: Not on file    Forced sexual activity: Not on file  Other Topics Concern  . Not on file  Social History Narrative  . Not on file      Review of Systems  Constitutional: Negative.  Negative for chills, fatigue and unexpected weight change.  HENT: Negative.  Negative for congestion, rhinorrhea, sneezing and sore throat.   Eyes: Negative for redness.  Respiratory: Negative.  Negative for cough, chest tightness and shortness of breath.   Cardiovascular: Negative.  Negative for chest pain and palpitations.  Gastrointestinal: Negative.  Negative for abdominal pain, constipation, diarrhea, nausea and vomiting.  Endocrine: Negative.   Genitourinary: Negative.  Negative for dysuria and frequency.  Musculoskeletal: Negative.   Negative for arthralgias, back pain, joint swelling and neck pain.  Skin: Negative.  Negative for rash.  Allergic/Immunologic: Negative.   Neurological: Negative.  Negative for tremors and numbness.  Hematological: Negative for adenopathy. Does not bruise/bleed easily.  Psychiatric/Behavioral: Negative.  Negative for behavioral problems, sleep disturbance and suicidal ideas. The patient is not nervous/anxious.     Vital Signs: Temp 100 F (37.8 C)    Observation/Objective:  Well sounding NAD noted   Assessment/Plan: 1. Exposure to 2019 Novel Coronavirus Will get Covid-19 testing, and follow up with patient when results are available.   2. Essential hypertension Stable, continue present management.   General Counseling: Kenneth Norman verbalizes understanding of the findings of today's phone visit and agrees with plan of treatment. I have discussed any further diagnostic evaluation that may be needed or ordered today. We also reviewed his medications today. he has been encouraged to call the office with any questions or concerns that should arise related to todays visit.    No orders of the defined types were placed in this encounter.   No orders of the defined types were placed in this encounter.   Time spent: [place] AGNP-C Internal medicine

## 2018-09-05 ENCOUNTER — Encounter (INDEPENDENT_AMBULATORY_CARE_PROVIDER_SITE_OTHER): Payer: Self-pay

## 2018-09-05 LAB — NOVEL CORONAVIRUS, NAA: SARS-CoV-2, NAA: NOT DETECTED

## 2018-09-06 ENCOUNTER — Ambulatory Visit
Admission: RE | Admit: 2018-09-06 | Discharge: 2018-09-06 | Disposition: A | Discharge status: Home / Self Care | Payer: Commercial Managed Care - PPO | Attending: Adult Health | Admitting: Adult Health

## 2018-09-06 ENCOUNTER — Other Ambulatory Visit: Payer: Self-pay

## 2018-09-06 ENCOUNTER — Encounter: Payer: Self-pay | Admitting: Adult Health

## 2018-09-06 ENCOUNTER — Ambulatory Visit: Payer: Commercial Managed Care - PPO | Admitting: Adult Health

## 2018-09-06 ENCOUNTER — Telehealth: Payer: Self-pay

## 2018-09-06 ENCOUNTER — Ambulatory Visit
Admission: RE | Admit: 2018-09-06 | Discharge: 2018-09-06 | Disposition: A | Discharge status: Home / Self Care | Payer: Commercial Managed Care - PPO | Source: Ambulatory Visit | Attending: Adult Health | Admitting: Adult Health

## 2018-09-06 VITALS — BP 144/82 | HR 97 | Temp 99.5°F | Ht 70.0 in | Wt 198.0 lb

## 2018-09-06 DIAGNOSIS — R059 Cough, unspecified: Secondary | ICD-10-CM

## 2018-09-06 DIAGNOSIS — J988 Other specified respiratory disorders: Secondary | ICD-10-CM

## 2018-09-06 DIAGNOSIS — I10 Essential (primary) hypertension: Secondary | ICD-10-CM | POA: Diagnosis not present

## 2018-09-06 DIAGNOSIS — R05 Cough: Secondary | ICD-10-CM

## 2018-09-06 MED ORDER — AZITHROMYCIN 250 MG PO TABS: ORAL_TABLET | ORAL | 0 refills | Status: DC

## 2018-09-06 MED ORDER — GUAIFENESIN-CODEINE 100-10 MG/5ML PO SYRP: 5.0000 mL | ORAL_SOLUTION | Freq: Every evening | ORAL | 0 refills | Status: DC | PRN

## 2018-09-06 NOTE — Progress Notes (Signed)
[facility] [place],  [address]  Internal MEDICINE  Telephone Visit  Patient Name: Kenneth Norman  [id number]  [id number]  Date of Service: 09/06/2018  I connected with the patient at 845 by telephone and verified the patients identity using two identifiers.   I discussed the limitations, risks, security and privacy concerns of performing an evaluation and management service by telephone and the availability of in person appointments. I also discussed with the patient that there may be a patient responsible charge related to the service.  The patient expressed understanding and agrees to proceed.    Chief Complaint  Patient presents with  . Telephone Assessment  . Telephone Screen  . Hypertension  . Sore Throat  . Cough  . Fatigue  . Shortness of Breath  . Results    negative for COVID    HPI  PT seen via video.  He reports he has continued productive cough.  His joint pain in his knees has improved with aleve.  He also reports his fever seems to have gotten under control with the aleve as well.  His Covid test was negative. His blood pressure is slightly elevated today, Denies Chest pain, Shortness of breath, palpitations, headache, or blurred vision.    Current Medication: Outpatient Encounter Medications as of 09/06/2018  Medication Sig  . amLODipine (NORVASC) 5 MG tablet Take 1 tablet (5 mg total) by mouth daily.  Marland Kitchen atorvastatin (LIPITOR) 20 MG tablet Take 20 mg by mouth daily.  Marland Kitchen umeclidinium-vilanterol (ANORO ELLIPTA) 62.5-25 MCG/INH AEPB Inhale 1 puff into the lungs daily.  Marland Kitchen azithromycin (ZITHROMAX) 250 MG tablet Take as directed  . guaiFENesin-codeine (ROBITUSSIN AC) 100-10 MG/5ML syrup Take 5 mLs by mouth at bedtime as needed for cough.   No facility-administered encounter medications on file as of 09/06/2018.     Surgical History: Past Surgical History:  Procedure Laterality Date  . NASAL SEPTUM SURGERY    . NASAL SINUS SURGERY       Medical History: Past Medical History:  Diagnosis Date  . COPD (chronic obstructive pulmonary disease) ([place])   . Hyperlipidemia     Family History: Family History  Problem Relation Age of Onset  . Cancer Mother   . Heart disease Father     Social History   Socioeconomic History  . Marital status: Married    Spouse name: Not on file  . Number of children: Not on file  . Years of education: Not on file  . Highest education level: Not on file  Occupational History  . Not on file  Social Needs  . Financial resource strain: Not on file  . Food insecurity    Worry: Not on file    Inability: Not on file  . Transportation needs    Medical: Not on file    Non-medical: Not on file  Tobacco Use  . Smoking status: Current Every Day Smoker  . Smokeless tobacco: Never Used  Substance and Sexual Activity  . Alcohol use: Yes    Comment: occasional  . Drug use: Never  . Sexual activity: Not on file  Lifestyle  . Physical activity    Days per week: Not on file    Minutes per session: Not on file  . Stress: Not on file  Relationships  . Social Herbalist on phone: Not on file    Gets together: Not on file    Attends religious service: Not on file  Active member of club or organization: Not on file    Attends meetings of clubs or organizations: Not on file    Relationship status: Not on file  . Intimate partner violence    Fear of current or ex partner: Not on file    Emotionally abused: Not on file    Physically abused: Not on file    Forced sexual activity: Not on file  Other Topics Concern  . Not on file  Social History Narrative  . Not on file      Review of Systems  Constitutional: Negative.  Negative for chills, fatigue and unexpected weight change.  HENT: Negative.  Negative for congestion, rhinorrhea, sneezing and sore throat.   Eyes: Negative for redness.  Respiratory: Negative.  Negative for cough, chest tightness and shortness of breath.    Cardiovascular: Negative.  Negative for chest pain and palpitations.  Gastrointestinal: Negative.  Negative for abdominal pain, constipation, diarrhea, nausea and vomiting.  Endocrine: Negative.   Genitourinary: Negative.  Negative for dysuria and frequency.  Musculoskeletal: Negative.  Negative for arthralgias, back pain, joint swelling and neck pain.  Skin: Negative.  Negative for rash.  Allergic/Immunologic: Negative.   Neurological: Negative.  Negative for tremors and numbness.  Hematological: Negative for adenopathy. Does not bruise/bleed easily.  Psychiatric/Behavioral: Negative.  Negative for behavioral problems, sleep disturbance and suicidal ideas. The patient is not nervous/anxious.     Vital Signs: BP (!) 144/82   Pulse 97   Temp 99.5 F (37.5 C)   Ht 5\' 10"  (1.778 m)   Wt 198 lb (89.8 kg)   BMI 28.41 kg/m    Observation/Objective:  Well appearing, NAD noted at this time.    Assessment/Plan: 1. Respiratory infection Advised patient to take entire course of antibiotics as prescribed with food. Pt should return to clinic in 7-10 days if symptoms fail to improve or new symptoms develop.  - azithromycin (ZITHROMAX) 250 MG tablet; Take as directed  Dispense: 6 tablet; Refill: 0  2. Cough Reviewed risks and possible side effects associated with taking opiates, benzodiazepines and other CNS depressants. Combination of these could cause dizziness and drowsiness. Advised patient not to drive or operate machinery when taking these medications, as patient's and other's life can be at risk and will have consequences. Patient verbalized understanding in this matter. Dependence and abuse for these drugs will be monitored closely. A Controlled substance policy and procedure is on file which allows [facility] to order a urine drug screen test at any visit. Patient understands and agrees with the plan - DG Chest 2 View; Future - guaiFENesin-codeine (ROBITUSSIN AC) 100-10  MG/5ML syrup; Take 5 mLs by mouth at bedtime as needed for cough.  Dispense: 118 mL; Refill: 0  3. Essential hypertension Will continue to monitor pressure at future visits.   General Counseling: Bayley verbalizes understanding of the findings of today's phone visit and agrees with plan of treatment. I have discussed any further diagnostic evaluation that may be needed or ordered today. We also reviewed his medications today. he has been encouraged to call the office with any questions or concerns that should arise related to todays visit.    Orders Placed This Encounter  Procedures  . DG Chest 2 View    Meds ordered this encounter  Medications  . azithromycin (ZITHROMAX) 250 MG tablet    Sig: Take as directed    Dispense:  6 tablet    Refill:  0  . guaiFENesin-codeine (ROBITUSSIN AC) 100-10  MG/5ML syrup    Sig: Take 5 mLs by mouth at bedtime as needed for cough.    Dispense:  118 mL    Refill:  0    Time spent: [place] AGNP-C Internal medicine

## 2018-09-06 NOTE — Telephone Encounter (Signed)
He has pneumonia on his CXR, he should take his azithromycin, and I want him to get a repeat CXR in 4 weeks to make sure it cleared up.

## 2018-09-07 NOTE — Telephone Encounter (Signed)
Should have a follow up

## 2018-09-08 ENCOUNTER — Telehealth: Payer: Self-pay | Admitting: Adult Health

## 2018-09-08 ENCOUNTER — Other Ambulatory Visit: Payer: Self-pay | Admitting: Adult Health

## 2018-09-08 MED ORDER — PREDNISONE 10 MG (21) PO TBPK: ORAL_TABLET | ORAL | 0 refills | Status: DC

## 2018-09-08 NOTE — Telephone Encounter (Signed)
We can add steroid dose pack.  He isn't even finished with his z-pak yet is he?

## 2018-09-08 NOTE — Telephone Encounter (Signed)
PT CALLED AND STATE THAT HIS PNEUMONIA IS NOT GETTING BETTER , WHAT ELSE CAN BE DONE

## 2018-09-13 ENCOUNTER — Other Ambulatory Visit: Payer: Self-pay | Admitting: Adult Health

## 2018-09-13 DIAGNOSIS — I10 Essential (primary) hypertension: Secondary | ICD-10-CM

## 2018-09-14 ENCOUNTER — Other Ambulatory Visit: Payer: Self-pay

## 2018-09-14 DIAGNOSIS — I10 Essential (primary) hypertension: Secondary | ICD-10-CM

## 2018-09-14 MED ORDER — AMLODIPINE BESYLATE 5 MG PO TABS: 5.0000 mg | ORAL_TABLET | Freq: Every day | ORAL | 0 refills | Status: DC

## 2018-09-21 ENCOUNTER — Ambulatory Visit: Payer: Commercial Managed Care - PPO | Admitting: Adult Health

## 2018-09-21 ENCOUNTER — Encounter: Payer: Self-pay | Admitting: Adult Health

## 2018-09-21 VITALS — BP 144/96 | HR 85 | Resp 16 | Ht 70.0 in | Wt 195.0 lb

## 2018-09-21 DIAGNOSIS — J438 Other emphysema: Secondary | ICD-10-CM

## 2018-09-21 DIAGNOSIS — I10 Essential (primary) hypertension: Secondary | ICD-10-CM | POA: Diagnosis not present

## 2018-09-21 DIAGNOSIS — R05 Cough: Secondary | ICD-10-CM | POA: Diagnosis not present

## 2018-09-21 DIAGNOSIS — R059 Cough, unspecified: Secondary | ICD-10-CM

## 2018-09-21 DIAGNOSIS — F17200 Nicotine dependence, unspecified, uncomplicated: Secondary | ICD-10-CM

## 2018-09-21 DIAGNOSIS — J988 Other specified respiratory disorders: Secondary | ICD-10-CM

## 2018-09-21 MED ORDER — TRELEGY ELLIPTA 100-62.5-25 MCG/INH IN AEPB: 1.0000 | INHALATION_SPRAY | Freq: Every day | RESPIRATORY_TRACT | 1 refills | Status: DC

## 2018-09-21 MED ORDER — AMLODIPINE BESYLATE 10 MG PO TABS: 10.0000 mg | ORAL_TABLET | Freq: Every day | ORAL | 1 refills | Status: DC

## 2018-09-21 NOTE — Progress Notes (Signed)
[facility] [place], [place] [address]  Internal MEDICINE  Office Visit Note  Patient Name: Kenneth Norman  [id number]  [id number]  Date of Service: 09/21/2018  Chief Complaint  Patient presents with  . Medical Management of Chronic Issues    weight management follow up   . Hypertension    HPI  PT is here for follow up on HTN, and pneumonia.  He reports his cough has resolved.  He is feeling much better.  Unfortunately he continues to smoke.  His CXR from 8/12 shows the pneumonia and radiology recommends a follow up CXR in 3-4 weeks to rule out neoplasm.    Current Medication: Outpatient Encounter Medications as of 09/21/2018  Medication Sig  . amLODipine (NORVASC) 10 MG tablet Take 1 tablet (10 mg total) by mouth daily.  Marland Kitchen atorvastatin (LIPITOR) 20 MG tablet Take 20 mg by mouth daily.  Marland Kitchen umeclidinium-vilanterol (ANORO ELLIPTA) 62.5-25 MCG/INH AEPB Inhale 1 puff into the lungs daily.  . [DISCONTINUED] amLODipine (NORVASC) 5 MG tablet Take 1 tablet (5 mg total) by mouth daily.  . [DISCONTINUED] amLODipine (NORVASC) 5 MG tablet Take 1 tablet by mouth once daily (Patient not taking: Reported on 09/21/2018)  . [DISCONTINUED] azithromycin (ZITHROMAX) 250 MG tablet Take as directed (Patient not taking: Reported on 09/21/2018)  . [DISCONTINUED] guaiFENesin-codeine (ROBITUSSIN AC) 100-10 MG/5ML syrup Take 5 mLs by mouth at bedtime as needed for cough.  . [DISCONTINUED] predniSONE (STERAPRED UNI-PAK 21 TAB) 10 MG (21) TBPK tablet USE AS DIRECTED (Patient not taking: Reported on 09/21/2018)   No facility-administered encounter medications on file as of 09/21/2018.     Surgical History: Past Surgical History:  Procedure Laterality Date  . NASAL SEPTUM SURGERY    . NASAL SINUS SURGERY      Medical History: Past Medical History:  Diagnosis Date  . COPD (chronic obstructive pulmonary disease) ([place])   . Hyperlipidemia     Family History: Family History   Problem Relation Age of Onset  . Cancer Mother   . Heart disease Father     Social History   Socioeconomic History  . Marital status: Married    Spouse name: Not on file  . Number of children: Not on file  . Years of education: Not on file  . Highest education level: Not on file  Occupational History  . Not on file  Social Needs  . Financial resource strain: Not on file  . Food insecurity    Worry: Not on file    Inability: Not on file  . Transportation needs    Medical: Not on file    Non-medical: Not on file  Tobacco Use  . Smoking status: Current Every Day Smoker  . Smokeless tobacco: Never Used  Substance and Sexual Activity  . Alcohol use: Yes    Comment: occasional  . Drug use: Never  . Sexual activity: Not on file  Lifestyle  . Physical activity    Days per week: Not on file    Minutes per session: Not on file  . Stress: Not on file  Relationships  . Social Herbalist on phone: Not on file    Gets together: Not on file    Attends religious service: Not on file    Active member of club or organization: Not on file    Attends meetings of clubs or organizations: Not on file    Relationship status: Not on file  . Intimate partner violence  Fear of current or ex partner: Not on file    Emotionally abused: Not on file    Physically abused: Not on file    Forced sexual activity: Not on file  Other Topics Concern  . Not on file  Social History Narrative  . Not on file      Review of Systems  Constitutional: Negative.  Negative for chills, fatigue and unexpected weight change.  HENT: Negative.  Negative for congestion, rhinorrhea, sneezing and sore throat.   Eyes: Negative for redness.  Respiratory: Negative.  Negative for cough, chest tightness and shortness of breath.   Cardiovascular: Negative.  Negative for chest pain and palpitations.  Gastrointestinal: Negative.  Negative for abdominal pain, constipation, diarrhea, nausea and vomiting.   Endocrine: Negative.   Genitourinary: Negative.  Negative for dysuria and frequency.  Musculoskeletal: Negative.  Negative for arthralgias, back pain, joint swelling and neck pain.  Skin: Negative.  Negative for rash.  Allergic/Immunologic: Negative.   Neurological: Negative.  Negative for tremors and numbness.  Hematological: Negative for adenopathy. Does not bruise/bleed easily.  Psychiatric/Behavioral: Negative.  Negative for behavioral problems, sleep disturbance and suicidal ideas. The patient is not nervous/anxious.     Vital Signs: BP (!) 144/96   Pulse 85   Resp 16   Ht 5\' 10"  (1.778 m)   Wt 195 lb (88.5 kg)   SpO2 97%   BMI 27.98 kg/m    Physical Exam Vitals signs and nursing note reviewed.  Constitutional:      General: He is not in acute distress.    Appearance: He is well-developed. He is not diaphoretic.  HENT:     Head: Normocephalic and atraumatic.     Mouth/Throat:     Pharynx: No oropharyngeal exudate.  Eyes:     Pupils: Pupils are equal, round, and reactive to light.  Neck:     Musculoskeletal: Normal range of motion and neck supple.     Thyroid: No thyromegaly.     Vascular: No JVD.     Trachea: No tracheal deviation.  Cardiovascular:     Rate and Rhythm: Normal rate and regular rhythm.     Heart sounds: Normal heart sounds. No murmur. No friction rub. No gallop.   Pulmonary:     Effort: Pulmonary effort is normal. No respiratory distress.     Breath sounds: Normal breath sounds. No wheezing or rales.  Chest:     Chest wall: No tenderness.  Abdominal:     Palpations: Abdomen is soft.     Tenderness: There is no abdominal tenderness. There is no guarding.  Musculoskeletal: Normal range of motion.  Lymphadenopathy:     Cervical: No cervical adenopathy.  Skin:    General: Skin is warm and dry.  Neurological:     Mental Status: He is alert and oriented to person, place, and time.     Cranial Nerves: No cranial nerve deficit.  Psychiatric:         Behavior: Behavior normal.        Thought Content: Thought content normal.        Judgment: Judgment normal.    Assessment/Plan: 1. Respiratory infection We discussed the patient will get chest x-ray in the coming weeks to follow-up on his film. - DG Chest 2 View; Future  2. Essential hypertension Slightly elevated today 144/96.  Patient encouraged to increase amlodipine to 10 mg daily.  We will follow-up in a few weeks.  Encourage patient to take blood pressure daily and keep  a log. - amLODipine (NORVASC) 10 MG tablet; Take 1 tablet (10 mg total) by mouth daily.  Dispense: 90 tablet; Refill: 1  3. Cough Stable, cough has improved since pneumonia reports he is back to his baseline smoker's cough.  4. Nicotine dependence with current use Smoking cessation counseling: 1. Pt acknowledges the risks of long term smoking, she will try to quite smoking. 2. Options for different medications including nicotine products, chewing gum, patch etc, Wellbutrin and Chantix is discussed 3. Goal and date of compete cessation is discussed 4. Total time spent in smoking cessation is 15 min.   General Counseling: Spiro verbalizes understanding of the findings of todays visit and agrees with plan of treatment. I have discussed any further diagnostic evaluation that may be needed or ordered today. We also reviewed his medications today. he has been encouraged to call the office with any questions or concerns that should arise related to todays visit.    Orders Placed This Encounter  Procedures  . DG Chest 2 View    Meds ordered this encounter  Medications  . amLODipine (NORVASC) 10 MG tablet    Sig: Take 1 tablet (10 mg total) by mouth daily.    Dispense:  90 tablet    Refill:  1    Time spent: 25 Minutes   This patient was seen by Orson Gear AGNP-C in Collaboration with Dr Lavera Guise as a part of collaborative care agreement     Kendell Bane AGNP-C Internal medicine

## 2018-10-03 ENCOUNTER — Other Ambulatory Visit: Payer: Self-pay

## 2018-10-03 ENCOUNTER — Ambulatory Visit
Admission: RE | Admit: 2018-10-03 | Discharge: 2018-10-03 | Disposition: A | Discharge status: Home / Self Care | Payer: Commercial Managed Care - PPO | Source: Ambulatory Visit | Attending: Adult Health | Admitting: Adult Health

## 2018-10-03 DIAGNOSIS — J988 Other specified respiratory disorders: Secondary | ICD-10-CM | POA: Insufficient documentation

## 2018-11-01 ENCOUNTER — Other Ambulatory Visit: Payer: Self-pay

## 2018-11-01 ENCOUNTER — Ambulatory Visit: Payer: Commercial Managed Care - PPO | Admitting: Adult Health

## 2018-11-01 ENCOUNTER — Encounter: Payer: Self-pay | Admitting: Adult Health

## 2018-11-01 VITALS — BP 130/90 | HR 74 | Temp 98.1°F | Resp 16 | Ht 70.0 in | Wt 197.0 lb

## 2018-11-01 DIAGNOSIS — E782 Mixed hyperlipidemia: Secondary | ICD-10-CM | POA: Diagnosis not present

## 2018-11-01 DIAGNOSIS — J988 Other specified respiratory disorders: Secondary | ICD-10-CM

## 2018-11-01 DIAGNOSIS — I10 Essential (primary) hypertension: Secondary | ICD-10-CM

## 2018-11-01 DIAGNOSIS — F17200 Nicotine dependence, unspecified, uncomplicated: Secondary | ICD-10-CM | POA: Diagnosis not present

## 2018-11-01 DIAGNOSIS — K4090 Unilateral inguinal hernia, without obstruction or gangrene, not specified as recurrent: Secondary | ICD-10-CM

## 2018-11-01 MED ORDER — ATORVASTATIN CALCIUM 20 MG PO TABS: 20.0000 mg | ORAL_TABLET | Freq: Every day | ORAL | 1 refills | Status: DC

## 2018-11-01 NOTE — Progress Notes (Addendum)
[facility] [place], [place] [address]  Internal MEDICINE  Office Visit Note  Patient Name: Kenneth Norman  [id number]  [id number]  Date of Service: 11/01/2018  Chief Complaint  Patient presents with  . Follow-up    cxr results  . Hypertension    HPI  Pt is here for follow up on CXR results.  His CXR shows near complete resolution of the opacity.  He reports feeling much better.  Denies any cough, sob, or wheezing.  He does continue to smoke, unfortunately. Pt also reports about 6 months of a bulge to his left side/groin. He reports at first it did not bother him.  He reports lately it has become intermittently painfull and firm overnight.       Current Medication: Outpatient Encounter Medications as of 11/01/2018  Medication Sig  . amLODipine (NORVASC) 10 MG tablet Take 1 tablet (10 mg total) by mouth daily.  Marland Kitchen atorvastatin (LIPITOR) 20 MG tablet Take 1 tablet (20 mg total) by mouth daily.  . Fluticasone-Umeclidin-Vilant (TRELEGY ELLIPTA) 100-62.5-25 MCG/INH AEPB Inhale 1 Inhaler into the lungs daily.  Marland Kitchen umeclidinium-vilanterol (ANORO ELLIPTA) 62.5-25 MCG/INH AEPB Inhale 1 puff into the lungs daily.  . [DISCONTINUED] atorvastatin (LIPITOR) 20 MG tablet Take 20 mg by mouth daily.   No facility-administered encounter medications on file as of 11/01/2018.     Surgical History: Past Surgical History:  Procedure Laterality Date  . NASAL SEPTUM SURGERY    . NASAL SINUS SURGERY      Medical History: Past Medical History:  Diagnosis Date  . COPD (chronic obstructive pulmonary disease) ([place])   . Hyperlipidemia     Family History: Family History  Problem Relation Age of Onset  . Cancer Mother   . Heart disease Father     Social History   Socioeconomic History  . Marital status: Married    Spouse name: Not on file  . Number of children: Not on file  . Years of education: Not on file  . Highest education level: Not on file  Occupational  History  . Not on file  Social Needs  . Financial resource strain: Not on file  . Food insecurity    Worry: Not on file    Inability: Not on file  . Transportation needs    Medical: Not on file    Non-medical: Not on file  Tobacco Use  . Smoking status: Current Every Day Smoker  . Smokeless tobacco: Never Used  Substance and Sexual Activity  . Alcohol use: Yes    Comment: occasional  . Drug use: Never  . Sexual activity: Not on file  Lifestyle  . Physical activity    Days per week: Not on file    Minutes per session: Not on file  . Stress: Not on file  Relationships  . Social Herbalist on phone: Not on file    Gets together: Not on file    Attends religious service: Not on file    Active member of club or organization: Not on file    Attends meetings of clubs or organizations: Not on file    Relationship status: Not on file  . Intimate partner violence    Fear of current or ex partner: Not on file    Emotionally abused: Not on file    Physically abused: Not on file    Forced sexual activity: Not on file  Other Topics Concern  . Not on file  Social  History Narrative  . Not on file      Review of Systems  Constitutional: Negative.  Negative for chills, fatigue and unexpected weight change.  HENT: Negative.  Negative for congestion, rhinorrhea, sneezing and sore throat.   Eyes: Negative for redness.  Respiratory: Negative.  Negative for cough, chest tightness and shortness of breath.   Cardiovascular: Negative.  Negative for chest pain and palpitations.  Gastrointestinal: Negative.  Negative for abdominal pain, constipation, diarrhea, nausea and vomiting.  Endocrine: Negative.   Genitourinary: Negative.  Negative for dysuria and frequency.  Musculoskeletal: Negative.  Negative for arthralgias, back pain, joint swelling and neck pain.  Skin: Negative.  Negative for rash.  Allergic/Immunologic: Negative.   Neurological: Negative.  Negative for tremors and  numbness.  Hematological: Negative for adenopathy. Does not bruise/bleed easily.  Psychiatric/Behavioral: Negative.  Negative for behavioral problems, sleep disturbance and suicidal ideas. The patient is not nervous/anxious.     Vital Signs: BP 130/90   Pulse 74   Temp 98.1 F (36.7 C)   Resp 16   Ht 5\' 10"  (1.778 m)   Wt 197 lb (89.4 kg)   SpO2 99%   BMI 28.27 kg/m    Physical Exam Vitals signs and nursing note reviewed.  Constitutional:      General: He is not in acute distress.    Appearance: He is well-developed. He is not diaphoretic.  HENT:     Head: Normocephalic and atraumatic.     Mouth/Throat:     Pharynx: No oropharyngeal exudate.  Eyes:     Pupils: Pupils are equal, round, and reactive to light.  Neck:     Musculoskeletal: Normal range of motion and neck supple.     Thyroid: No thyromegaly.     Vascular: No JVD.     Trachea: No tracheal deviation.  Cardiovascular:     Rate and Rhythm: Normal rate and regular rhythm.     Heart sounds: Normal heart sounds. No murmur. No friction rub. No gallop.   Pulmonary:     Effort: Pulmonary effort is normal. No respiratory distress.     Breath sounds: Normal breath sounds. No wheezing or rales.  Chest:     Chest wall: No tenderness.  Abdominal:     Palpations: Abdomen is soft.     Tenderness: There is no abdominal tenderness. There is no guarding.  Musculoskeletal: Normal range of motion.  Lymphadenopathy:     Cervical: No cervical adenopathy.  Skin:    General: Skin is warm and dry.  Neurological:     Mental Status: He is alert and oriented to person, place, and time.     Cranial Nerves: No cranial nerve deficit.  Psychiatric:        Behavior: Behavior normal.        Thought Content: Thought content normal.        Judgment: Judgment normal.    Assessment/Plan: 1. Essential hypertension Remains slightly elevated.   2. Respiratory infection Resolved at this time.   3. Mixed hyperlipidemia Refilled  Lipitor for patient.  - atorvastatin (LIPITOR) 20 MG tablet; Take 1 tablet (20 mg total) by mouth daily.  Dispense: 90 tablet; Refill: 1  4. Nicotine dependence with current use Smoking cessation counseling: 1. Pt acknowledges the risks of long term smoking, she will try to quite smoking. 2. Options for different medications including nicotine products, chewing gum, patch etc, Wellbutrin and Chantix is discussed 3. Goal and date of compete cessation is discussed 4. Total time spent  in smoking cessation is 15 min.  5. Left groin hernia -Surgical consult for apparent hernia.  General Counseling: Gray verbalizes understanding of the findings of todays visit and agrees with plan of treatment. I have discussed any further diagnostic evaluation that may be needed or ordered today. We also reviewed his medications today. he has been encouraged to call the office with any questions or concerns that should arise related to todays visit.    No orders of the defined types were placed in this encounter.   Meds ordered this encounter  Medications  . atorvastatin (LIPITOR) 20 MG tablet    Sig: Take 1 tablet (20 mg total) by mouth daily.    Dispense:  90 tablet    Refill:  1    Time spent: 25 Minutes   This patient was seen by Orson Gear AGNP-C in Collaboration with Dr Lavera Guise as a part of collaborative care agreement     Kendell Bane AGNP-C Internal medicine

## 2018-11-30 ENCOUNTER — Telehealth: Payer: Self-pay

## 2018-12-07 ENCOUNTER — Other Ambulatory Visit: Payer: Self-pay | Admitting: Adult Health

## 2018-12-07 ENCOUNTER — Encounter: Payer: Self-pay | Admitting: Nurse Practitioner

## 2018-12-07 DIAGNOSIS — J438 Other emphysema: Secondary | ICD-10-CM

## 2018-12-08 ENCOUNTER — Other Ambulatory Visit: Payer: Self-pay | Admitting: Adult Health

## 2018-12-08 NOTE — Addendum Note (Signed)
Addended by: Kendell Bane on: 12/08/2018 05:46 PM   Modules accepted: Level of Service

## 2018-12-15 NOTE — Telephone Encounter (Signed)
Referral sent in on 12/11/2018

## 2018-12-19 ENCOUNTER — Ambulatory Visit: Payer: Self-pay | Admitting: Surgery

## 2018-12-19 ENCOUNTER — Ambulatory Visit: Payer: Commercial Managed Care - PPO | Admitting: Surgery

## 2018-12-19 ENCOUNTER — Other Ambulatory Visit: Payer: Self-pay

## 2018-12-19 VITALS — BP 163/74 | HR 83 | Temp 97.9°F | Resp 16 | Ht 70.0 in | Wt 200.8 lb

## 2018-12-19 DIAGNOSIS — K4090 Unilateral inguinal hernia, without obstruction or gangrene, not specified as recurrent: Secondary | ICD-10-CM

## 2018-12-19 NOTE — Patient Instructions (Signed)
Our surgery scheduler Levada Dy will contact you within 24-48 hours to discuss getting you scheduled for surgery.   Please have the BLUE sheet available when our scheduler contacts you to get you scheduled. If you have any questions please contact our office at [phone].

## 2018-12-19 NOTE — Progress Notes (Signed)
Patient ID: Kenneth Norman, male   DOB: 1960-08-02, 58 y.o.   MRN: [id number]  Chief Complaint: Left inguinal hernia  History of Present Illness Kenneth Norman is a 58 y.o. male with complaint of left groin area hernia since February/March 2020.  He complains of left groin burning, bulging and progressive annoyance.  He reports this is exacerbated by changes in position and has even awakened in the middle of the night having pain in the groin.  He does note coughing or sneezing can exacerbate the pain.   typically the bulge that is present diminishes but never completely goes away in recumbent positioning.  He has no known similar pain in the right groin.  Past Medical History Past Medical History:  Diagnosis Date  . COPD (chronic obstructive pulmonary disease) ([place])   . Hyperlipidemia       Past Surgical History:  Procedure Laterality Date  . NASAL SEPTUM SURGERY    . NASAL SINUS SURGERY      No Known Allergies  Current Outpatient Medications  Medication Sig Dispense Refill  . amLODipine (NORVASC) 10 MG tablet Take 1 tablet (10 mg total) by mouth daily. 90 tablet 1  . atorvastatin (LIPITOR) 20 MG tablet Take 1 tablet (20 mg total) by mouth daily. 90 tablet 1  . TRELEGY ELLIPTA 100-62.5-25 MCG/INH AEPB INHALE 1 INHALER INTO LUNGS DAILY 30 each 3   No current facility-administered medications for this visit.     Family History Family History  Problem Relation Age of Onset  . Cancer Mother   . Heart disease Father       Social History Social History   Tobacco Use  . Smoking status: Current Every Day Smoker  . Smokeless tobacco: Never Used  Substance Use Topics  . Alcohol use: Yes    Comment: occasional  . Drug use: Never        Review of Systems  Constitutional: Negative.   HENT: Negative.   Eyes: Negative.   Respiratory: Negative.   Cardiovascular: Negative.   Genitourinary: Negative.   Musculoskeletal: Negative.   Skin: Negative.   Neurological: Negative.    Endo/Heme/Allergies: Negative.       Physical Exam Blood pressure (!) 163/74, pulse 83, temperature 97.9 F (36.6 C), temperature source Temporal, resp. rate 16, height 5\' 10"  (1.778 m), weight 200 lb 12.8 oz (91.1 kg), SpO2 97 %. Last Weight  Most recent update: 12/19/2018  1:30 PM   Weight  91.1 kg (200 lb 12.8 oz)            CONSTITUTIONAL: Well developed, and nourished, appropriately responsive and aware without distress.   EYES: Sclera non-icteric.   EARS, NOSE, MOUTH AND THROAT: Mask worn. Hearing is intact to voice.  NECK: Trachea is midline, and there is no jugular venous distension.  RESPIRATORY:  Lungs are clear, and breath sounds are equal bilaterally. Normal respiratory effort without pathologic use of accessory muscles. CARDIOVASCULAR: Heart is regular in rate and rhythm. GI: The abdomen is soft, nontender, and nondistended. There were no palpable masses. I did not appreciate hepatosplenomegaly.  GU: Bilateral testes well descended.  Obvious large asymmetry with bulge in the left groin extending into the proximal scrotum.  No appreciable right-sided hernia. MUSCULOSKELETAL:  Symmetrical muscle tone appreciated in all four extremities.    SKIN: Skin turgor is normal. No pathologic skin lesions appreciated.  NEUROLOGIC:  Motor and sensation appear grossly normal.  Cranial nerves are grossly without defect. PSYCH:  Alert and oriented to person,  place and time. Affect is appropriate for situation.  Data Reviewed I have personally reviewed what is currently available of the patient's imaging, recent labs and medical records.    Assessment   Left inguinal hernia.  Patient Active Problem List   Diagnosis Date Noted  . Allergic rhinitis 08/08/2018  . Mixed hyperlipidemia 08/02/2016  . Chronic bronchitis with COPD (chronic obstructive pulmonary disease) ([place]) 11/22/2015  . Essential hypertension 05/26/2015    Plan    Robotic left inguinal herniorrhaphy, possible  bilateral. I discussed possibility of incarceration, strangulation, enlargement in size over time, and the risk of emergency surgery in the face of strangulation.  Also discussed the risk of surgery including recurrence, use of prosthetic materials (mesh) and the risk of infxn, post-op infxn and the possible need for re-operation and removal of mesh if used, and the risks of general anesthetic, even reaction to anesthetic medications. The patient and family understands the risks, any and all questions were answered to the patient's satisfaction.  Face-to-face time spent with the patient and accompanying care providers(if present) was 30 minutes, with more than 50% of the time spent counseling, educating, and coordinating care of the patient.      [name] 12/19/2018, 2:54 PM

## 2018-12-25 ENCOUNTER — Ambulatory Visit: Payer: Self-pay | Admitting: Surgery

## 2018-12-25 DIAGNOSIS — K4090 Unilateral inguinal hernia, without obstruction or gangrene, not specified as recurrent: Secondary | ICD-10-CM

## 2018-12-26 ENCOUNTER — Telehealth: Payer: Self-pay | Admitting: Surgery

## 2018-12-26 NOTE — Telephone Encounter (Signed)
Pt has been advised of pre admission date/time, Covid Testing date and Surgery date.  Surgery Date: 02/14/19 with Dr Wyline Beady assisted  Left inguinal hernia repair.  Preadmission Testing Date: 02/05/19 between 8-1:00pm-phone interview.  Covid Testing Date: 02/12/19-between 8-10:30am - patient advised to go to the [facility] ([place])  [employer] Video sent via [employer] [facility] and [employer].  Patient has been made aware to call [phone], between 1-3:00pm the day before surgery, to find out what time to arrive.

## 2018-12-28 ENCOUNTER — Inpatient Hospital Stay: Admission: RE | Admit: 2018-12-28 | Payer: Commercial Managed Care - PPO | Source: Ambulatory Visit

## 2018-12-29 ENCOUNTER — Other Ambulatory Visit: Payer: Commercial Managed Care - PPO

## 2019-01-31 ENCOUNTER — Telehealth: Payer: Self-pay

## 2019-01-31 NOTE — Telephone Encounter (Signed)
Confirmed appointment with patient. klh °

## 2019-02-02 ENCOUNTER — Ambulatory Visit: Payer: Commercial Managed Care - PPO | Admitting: Adult Health

## 2019-02-05 ENCOUNTER — Other Ambulatory Visit: Payer: Self-pay

## 2019-02-05 ENCOUNTER — Encounter
Admission: RE | Admit: 2019-02-05 | Discharge: 2019-02-05 | Disposition: A | Discharge status: Home / Self Care | Payer: Commercial Managed Care - PPO | Source: Ambulatory Visit | Attending: Surgery | Admitting: Surgery

## 2019-02-05 ENCOUNTER — Ambulatory Visit: Payer: Commercial Managed Care - PPO | Admitting: Adult Health

## 2019-02-05 DIAGNOSIS — J449 Chronic obstructive pulmonary disease, unspecified: Secondary | ICD-10-CM | POA: Diagnosis not present

## 2019-02-05 DIAGNOSIS — I10 Essential (primary) hypertension: Secondary | ICD-10-CM | POA: Diagnosis not present

## 2019-02-05 DIAGNOSIS — Z01818 Encounter for other preprocedural examination: Secondary | ICD-10-CM | POA: Insufficient documentation

## 2019-02-05 DIAGNOSIS — Z01812 Encounter for preprocedural laboratory examination: Secondary | ICD-10-CM | POA: Insufficient documentation

## 2019-02-05 DIAGNOSIS — Z01810 Encounter for preprocedural cardiovascular examination: Secondary | ICD-10-CM | POA: Insufficient documentation

## 2019-02-05 HISTORY — DX: Essential (primary) hypertension: I10

## 2019-02-05 HISTORY — DX: Pneumonia, unspecified organism: J18.9

## 2019-02-05 HISTORY — DX: Malignant (primary) neoplasm, unspecified: C80.1

## 2019-02-05 HISTORY — DX: Gastro-esophageal reflux disease without esophagitis: K21.9

## 2019-02-05 LAB — BASIC METABOLIC PANEL
Anion gap: 9 (ref 5–15)
BUN: 18 mg/dL (ref 6–20)
CO2: 25 mmol/L (ref 22–32)
Calcium: 9.6 mg/dL (ref 8.9–10.3)
Chloride: 105 mmol/L (ref 98–111)
Creatinine, Ser: 0.82 mg/dL (ref 0.61–1.24)
GFR calc Af Amer: >60 mL/min (ref >60)
GFR calc non Af Amer: >60 mL/min (ref >60)
Glucose, Bld: 123 mg/dL — ABNORMAL HIGH (ref 70–99)
Potassium: 3.8 mmol/L (ref 3.5–5.1)
Sodium: 139 mmol/L (ref 135–145)

## 2019-02-05 LAB — CBC WITH DIFFERENTIAL/PLATELET
Abs Immature Granulocytes: 0.07 10*3/uL (ref 0.00–0.07)
Basophils Absolute: 0.1 10*3/uL (ref 0.0–0.1)
Basophils Relative: 1 %
Eosinophils Absolute: 0.2 10*3/uL (ref 0.0–0.5)
Eosinophils Relative: 2 %
HCT: 46.1 % (ref 39.0–52.0)
Hemoglobin: 16.1 g/dL (ref 13.0–17.0)
Immature Granulocytes: 1 %
Lymphocytes Relative: 31 %
Lymphs Abs: 2.8 10*3/uL (ref 0.7–4.0)
MCH: 30.3 pg (ref 26.0–34.0)
MCHC: 34.9 g/dL (ref 30.0–36.0)
MCV: 86.8 fL (ref 80.0–100.0)
Monocytes Absolute: 0.6 10*3/uL (ref 0.1–1.0)
Monocytes Relative: 6 %
Neutro Abs: 5.5 10*3/uL (ref 1.7–7.7)
Neutrophils Relative %: 59 %
Platelets: 249 10*3/uL (ref 150–400)
RBC: 5.31 MIL/uL (ref 4.22–5.81)
RDW: 12.5 % (ref 11.5–15.5)
WBC: 9.3 10*3/uL (ref 4.0–10.5)
nRBC: 0 % (ref 0.0–0.2)

## 2019-02-05 NOTE — Patient Instructions (Addendum)
Your procedure is scheduled on: 02-14-19 [facility] Report to Same Day Surgery 2nd floor medical mall [facility] Entrance-take elevator on left to 2nd floor.  Check in with surgery information desk.) To find out your arrival time please call [phone] between 1PM - 3PM on 02-13-19 TUESDAY  Remember: Instructions that are not followed completely may result in serious medical risk, up to and including death, or upon the discretion of your surgeon and anesthesiologist your surgery may need to be rescheduled.    _x___ 1. Do not eat food after midnight the night before your procedure. NO GUM OR CANDY AFTER MIDNIGHT. You may drink clear liquids up to 2 hours before you are scheduled to arrive at the hospital for your procedure.  Do not drink clear liquids within 2 hours of your scheduled arrival to the hospital.  Clear liquids include  --Water or Apple juice without pulp  --Gatorade  --Black Coffee or Clear Tea (No milk, no creamers, do not add anything to the coffee or Tea) SUGAR IS OK TO ADD    ____Ensure clear carbohydrate drink on the way to the hospital for bariatric patients  ____Ensure clear carbohydrate drink 3 hours before surgery.    __x__ 2. No Alcohol for 24 hours before or after surgery.   __x__3. No Smoking or e-cigarettes for 24 prior to surgery.  Do not use any chewable tobacco products for at least 6 hour prior to surgery   ____  4. Bring all medications with you on the day of surgery if instructed.    __x__ 5. Notify your doctor if there is any change in your medical condition     (cold, fever, infections).    x___6. On the morning of surgery brush your teeth with toothpaste and water.  You may rinse your mouth with mouth wash if you wish.  Do not swallow any toothpaste or mouthwash.   Do not wear jewelry, make-up, hairpins, clips or nail polish.  Do not wear lotions, powders, or perfumes. You may wear deodorant.  Do not shave 48 hours prior to surgery. Men may shave  face and neck.  Do not bring valuables to the hospital.    [facility] is not responsible for any belongings or valuables.               Contacts, dentures or bridgework may not be worn into surgery.  Leave your suitcase in the car. After surgery it may be brought to your room.  For patients admitted to the hospital, discharge time is determined by your treatment team.  _  Patients discharged the day of surgery will not be allowed to drive home.  You will need someone to drive you home and stay with you the night of your procedure.    Please read over the following fact sheets that you were given:   [facility] Preparing for Surgery   _x___ TAKE THE FOLLOWING MEDICATION THE MORNING OF SURGERY WITH A SMALL SIP OF WATER. These include:  1. OMEPRAZOLE (PRILOSEC)  2. TAKE AN OMEPRAZOLE THE NIGHT BEFORE YOUR SURGERY  3.  4.  5.  6.  ____Fleets enema or Magnesium Citrate as directed.   _x___ Use CHG Soap or sage wipes as directed on instruction sheet   _X___ Use inhalers on the day of surgery-USE YOUR TRELEGY ELLIPTA INHALER DAY OF SURGERY   ____ Stop Metformin and Janumet 2 days prior to surgery.    ____ Take 1/2 of usual insulin dose the night before  surgery and none on the morning surgery.   ____ Follow recommendations from Cardiologist, Pulmonologist or PCP regarding stopping Aspirin, Coumadin, Plavix ,Eliquis, Effient, or Pradaxa, and Pletal.  X____Stop Anti-inflammatories such as Advil, Aleve, Ibuprofen, Motrin, Naproxen, Naprosyn, Goodies powders,EXCEDRIN or aspirin products 7 DAYS PRIOR TO SURGERY-OK to take Tylenol   ____ Stop supplements until after surgery.   ____ Bring C-Pap to the hospital.

## 2019-02-12 ENCOUNTER — Other Ambulatory Visit
Admission: RE | Admit: 2019-02-12 | Discharge: 2019-02-12 | Disposition: A | Discharge status: Home / Self Care | Payer: Commercial Managed Care - PPO | Source: Ambulatory Visit | Attending: Surgery | Admitting: Surgery

## 2019-02-12 DIAGNOSIS — Z20822 Contact with and (suspected) exposure to covid-19: Secondary | ICD-10-CM | POA: Insufficient documentation

## 2019-02-12 DIAGNOSIS — Z01812 Encounter for preprocedural laboratory examination: Secondary | ICD-10-CM | POA: Insufficient documentation

## 2019-02-12 LAB — SARS CORONAVIRUS 2 (TAT 6-24 HRS): SARS Coronavirus 2: NEGATIVE

## 2019-02-14 ENCOUNTER — Encounter: Payer: Self-pay | Admitting: Surgery

## 2019-02-14 ENCOUNTER — Other Ambulatory Visit: Payer: Self-pay | Admitting: Surgery

## 2019-02-14 ENCOUNTER — Ambulatory Visit
Admission: RE | Admit: 2019-02-14 | Discharge: 2019-02-14 | Disposition: A | Discharge status: Home / Self Care | Payer: Commercial Managed Care - PPO | Attending: Surgery | Admitting: Surgery

## 2019-02-14 ENCOUNTER — Encounter
Admission: RE | Disposition: A | Discharge status: Home / Self Care | Payer: Self-pay | Source: Home / Self Care | Attending: Surgery

## 2019-02-14 ENCOUNTER — Other Ambulatory Visit: Payer: Self-pay

## 2019-02-14 ENCOUNTER — Ambulatory Visit: Payer: Commercial Managed Care - PPO | Admitting: Anesthesiology

## 2019-02-14 DIAGNOSIS — I10 Essential (primary) hypertension: Secondary | ICD-10-CM | POA: Diagnosis not present

## 2019-02-14 DIAGNOSIS — Z79899 Other long term (current) drug therapy: Secondary | ICD-10-CM | POA: Diagnosis not present

## 2019-02-14 DIAGNOSIS — F17200 Nicotine dependence, unspecified, uncomplicated: Secondary | ICD-10-CM | POA: Diagnosis not present

## 2019-02-14 DIAGNOSIS — E782 Mixed hyperlipidemia: Secondary | ICD-10-CM | POA: Diagnosis not present

## 2019-02-14 DIAGNOSIS — J449 Chronic obstructive pulmonary disease, unspecified: Secondary | ICD-10-CM | POA: Insufficient documentation

## 2019-02-14 DIAGNOSIS — Z809 Family history of malignant neoplasm, unspecified: Secondary | ICD-10-CM | POA: Insufficient documentation

## 2019-02-14 DIAGNOSIS — K4090 Unilateral inguinal hernia, without obstruction or gangrene, not specified as recurrent: Secondary | ICD-10-CM

## 2019-02-14 DIAGNOSIS — Z8249 Family history of ischemic heart disease and other diseases of the circulatory system: Secondary | ICD-10-CM | POA: Insufficient documentation

## 2019-02-14 HISTORY — PX: XI ROBOTIC ASSISTED INGUINAL HERNIA REPAIR WITH MESH: SHX6706

## 2019-02-14 SURGERY — REPAIR, HERNIA, INGUINAL, ROBOT-ASSISTED, LAPAROSCOPIC, USING MESH
Anesthesia: General | Site: Abdomen | Laterality: Left

## 2019-02-14 MED ORDER — PROPOFOL 10 MG/ML IV BOLUS
INTRAVENOUS | Status: AC
  Filled 2019-02-14: qty 40

## 2019-02-14 MED ORDER — LACTATED RINGERS IV SOLN: INTRAVENOUS | Status: DC

## 2019-02-14 MED ORDER — CELECOXIB 200 MG PO CAPS: 200.0000 mg | ORAL_CAPSULE | ORAL | Status: AC

## 2019-02-14 MED ORDER — ACETAMINOPHEN 500 MG PO TABS: 1000.0000 mg | ORAL_TABLET | Freq: Once | ORAL | Status: AC

## 2019-02-14 MED ORDER — CEFAZOLIN SODIUM-DEXTROSE 2-4 GM/100ML-% IV SOLN
2.0000 g | INTRAVENOUS | Status: AC
  Administered 2019-02-14: 2 g via INTRAVENOUS

## 2019-02-14 MED ORDER — CELECOXIB 200 MG PO CAPS
ORAL_CAPSULE | ORAL | Status: AC
  Administered 2019-02-14: 200 mg via ORAL
  Filled 2019-02-14: qty 1

## 2019-02-14 MED ORDER — FENTANYL CITRATE (PF) 100 MCG/2ML IJ SOLN
INTRAMUSCULAR | Status: DC | PRN
  Administered 2019-02-14 (×2): 50 ug via INTRAVENOUS

## 2019-02-14 MED ORDER — BUPIVACAINE HCL (PF) 0.25 % IJ SOLN
INTRAMUSCULAR | Status: AC
  Filled 2019-02-14: qty 30

## 2019-02-14 MED ORDER — CHLORHEXIDINE GLUCONATE CLOTH 2 % EX PADS
6.0000 | MEDICATED_PAD | Freq: Once | CUTANEOUS | Status: AC
  Administered 2019-02-14: 6 via TOPICAL

## 2019-02-14 MED ORDER — GABAPENTIN 300 MG PO CAPS
ORAL_CAPSULE | ORAL | Status: AC
  Administered 2019-02-14: 300 mg via ORAL
  Filled 2019-02-14: qty 1

## 2019-02-14 MED ORDER — ONDANSETRON HCL 4 MG/2ML IJ SOLN
INTRAMUSCULAR | Status: AC
  Filled 2019-02-14: qty 2

## 2019-02-14 MED ORDER — PROPOFOL 10 MG/ML IV BOLUS
INTRAVENOUS | Status: DC | PRN
  Administered 2019-02-14: 150 mg via INTRAVENOUS

## 2019-02-14 MED ORDER — ROCURONIUM BROMIDE 100 MG/10ML IV SOLN
INTRAVENOUS | Status: DC | PRN
  Administered 2019-02-14: 30 mg via INTRAVENOUS
  Administered 2019-02-14 (×2): 20 mg via INTRAVENOUS

## 2019-02-14 MED ORDER — ACETAMINOPHEN 500 MG PO TABS
ORAL_TABLET | ORAL | Status: AC
  Administered 2019-02-14: 1000 mg via ORAL
  Filled 2019-02-14: qty 2

## 2019-02-14 MED ORDER — MIDAZOLAM HCL 2 MG/2ML IJ SOLN
INTRAMUSCULAR | Status: AC
  Filled 2019-02-14: qty 2

## 2019-02-14 MED ORDER — SUGAMMADEX SODIUM 200 MG/2ML IV SOLN
INTRAVENOUS | Status: AC
  Filled 2019-02-14: qty 2

## 2019-02-14 MED ORDER — FENTANYL CITRATE (PF) 100 MCG/2ML IJ SOLN: 25.0000 ug | INTRAMUSCULAR | Status: DC | PRN

## 2019-02-14 MED ORDER — MIDAZOLAM HCL 2 MG/2ML IJ SOLN
INTRAMUSCULAR | Status: DC | PRN
  Administered 2019-02-14: 2 mg via INTRAVENOUS

## 2019-02-14 MED ORDER — LIDOCAINE HCL (PF) 2 % IJ SOLN
INTRAMUSCULAR | Status: AC
  Filled 2019-02-14: qty 10

## 2019-02-14 MED ORDER — BUPIVACAINE-EPINEPHRINE (PF) 0.25% -1:200000 IJ SOLN
INTRAMUSCULAR | Status: DC | PRN
  Administered 2019-02-14: 23 mL

## 2019-02-14 MED ORDER — OXYCODONE HCL 5 MG PO TABS: 5.0000 mg | ORAL_TABLET | Freq: Once | ORAL | Status: DC | PRN

## 2019-02-14 MED ORDER — LIDOCAINE HCL (CARDIAC) PF 100 MG/5ML IV SOSY
PREFILLED_SYRINGE | INTRAVENOUS | Status: DC | PRN
  Administered 2019-02-14: 100 mg via INTRAVENOUS

## 2019-02-14 MED ORDER — OXYCODONE HCL 5 MG/5ML PO SOLN: 5.0000 mg | Freq: Once | ORAL | Status: DC | PRN

## 2019-02-14 MED ORDER — EPINEPHRINE PF 1 MG/ML IJ SOLN
INTRAMUSCULAR | Status: AC
  Filled 2019-02-14: qty 1

## 2019-02-14 MED ORDER — DEXAMETHASONE SODIUM PHOSPHATE 10 MG/ML IJ SOLN
INTRAMUSCULAR | Status: DC | PRN
  Administered 2019-02-14: 5 mg via INTRAVENOUS

## 2019-02-14 MED ORDER — ROCURONIUM BROMIDE 50 MG/5ML IV SOLN
INTRAVENOUS | Status: AC
  Filled 2019-02-14: qty 1

## 2019-02-14 MED ORDER — CHLORHEXIDINE GLUCONATE CLOTH 2 % EX PADS: 6.0000 | MEDICATED_PAD | Freq: Once | CUTANEOUS | Status: DC

## 2019-02-14 MED ORDER — FENTANYL CITRATE (PF) 100 MCG/2ML IJ SOLN
INTRAMUSCULAR | Status: AC
  Filled 2019-02-14: qty 2

## 2019-02-14 MED ORDER — ACETAMINOPHEN 500 MG PO TABS: 1000.0000 mg | ORAL_TABLET | ORAL | Status: AC

## 2019-02-14 MED ORDER — IBUPROFEN 800 MG PO TABS: 800.0000 mg | ORAL_TABLET | Freq: Three times a day (TID) | ORAL | 0 refills | Status: DC | PRN

## 2019-02-14 MED ORDER — CEFAZOLIN SODIUM-DEXTROSE 2-4 GM/100ML-% IV SOLN
INTRAVENOUS | Status: AC
  Filled 2019-02-14: qty 100

## 2019-02-14 MED ORDER — HYDROCODONE-ACETAMINOPHEN 5-325 MG PO TABS: 2.0000 | ORAL_TABLET | Freq: Four times a day (QID) | ORAL | 0 refills | Status: DC | PRN

## 2019-02-14 MED ORDER — GABAPENTIN 300 MG PO CAPS: 300.0000 mg | ORAL_CAPSULE | ORAL | Status: AC

## 2019-02-14 MED ORDER — SUGAMMADEX SODIUM 500 MG/5ML IV SOLN
INTRAVENOUS | Status: DC | PRN
  Administered 2019-02-14: 200 mg via INTRAVENOUS

## 2019-02-14 SURGICAL SUPPLY — 46 items
BLADE CLIPPER SURG (BLADE) ×3 IMPLANT
CANISTER SUCT 1200ML W/VALVE (MISCELLANEOUS) ×3 IMPLANT
CHLORAPREP W/TINT 26 (MISCELLANEOUS) ×3 IMPLANT
COVER TIP SHEARS 8 DVNC (MISCELLANEOUS) ×1 IMPLANT
COVER TIP SHEARS 8MM DA VINCI (MISCELLANEOUS) ×2
COVER WAND RF STERILE (DRAPES) ×2 IMPLANT
DEFOGGER SCOPE WARMER CLEARIFY (MISCELLANEOUS) ×3 IMPLANT
DERMABOND ADVANCED (GAUZE/BANDAGES/DRESSINGS) ×2
DERMABOND ADVANCED .7 DNX12 (GAUZE/BANDAGES/DRESSINGS) ×1 IMPLANT
DRAPE 3/4 80X56 (DRAPES) ×3 IMPLANT
DRAPE ARM DVNC X/XI (DISPOSABLE) ×3 IMPLANT
DRAPE COLUMN DVNC XI (DISPOSABLE) ×1 IMPLANT
DRAPE DA VINCI XI ARM (DISPOSABLE) ×4
DRAPE DA VINCI XI COLUMN (DISPOSABLE) ×2
ELECT REM PT RETURN 9FT ADLT (ELECTROSURGICAL) ×3
ELECTRODE REM PT RTRN 9FT ADLT (ELECTROSURGICAL) ×1 IMPLANT
GLOVE BIO SURGEON STRL SZ 6.5 (GLOVE) ×3 IMPLANT
GLOVE BIO SURGEONS STRL SZ 6.5 (GLOVE) ×3
GLOVE BIOGEL PI IND STRL 6.5 (GLOVE) IMPLANT
GLOVE BIOGEL PI INDICATOR 6.5 (GLOVE) ×6
GLOVE ORTHO TXT STRL SZ7.5 (GLOVE) ×9 IMPLANT
GOWN STRL REUS W/ TWL LRG LVL3 (GOWN DISPOSABLE) ×3 IMPLANT
GOWN STRL REUS W/TWL LRG LVL3 (GOWN DISPOSABLE) ×8
IRRIGATION STRYKERFLOW (MISCELLANEOUS) IMPLANT
IRRIGATOR STRYKERFLOW (MISCELLANEOUS)
IV CATH ANGIO 14GX1.88 NO SAFE (IV SOLUTION) ×3 IMPLANT
IV NS 1000ML (IV SOLUTION)
IV NS 1000ML BAXH (IV SOLUTION) IMPLANT
KIT PINK PAD W/HEAD ARE REST (MISCELLANEOUS) ×3
KIT PINK PAD W/HEAD ARM REST (MISCELLANEOUS) ×1 IMPLANT
LABEL OR SOLS (LABEL) ×3 IMPLANT
MESH 3DMAX LIGHT 4.1X6.2 LT LR (Mesh General) ×2 IMPLANT
NDL INSUFFLATION 14GA 120MM (NEEDLE) ×1 IMPLANT
NEEDLE HYPO 22GX1.5 SAFETY (NEEDLE) ×3 IMPLANT
NEEDLE INSUFFLATION 14GA 120MM (NEEDLE) ×3 IMPLANT
PACK LAP CHOLECYSTECTOMY (MISCELLANEOUS) ×3 IMPLANT
SEAL CANN UNIV 5-8 DVNC XI (MISCELLANEOUS) ×3 IMPLANT
SEAL XI 5MM-8MM UNIVERSAL (MISCELLANEOUS) ×6
SET TUBE SMOKE EVAC HIGH FLOW (TUBING) ×3 IMPLANT
SOLUTION ELECTROLUBE (MISCELLANEOUS) ×3 IMPLANT
SUT MNCRL AB 4-0 PS2 18 (SUTURE) ×3 IMPLANT
SUT VIC AB 2-0 SH 27 (SUTURE) ×2
SUT VIC AB 2-0 SH 27XBRD (SUTURE) ×1 IMPLANT
SUT VICRYL 0 AB UR-6 (SUTURE) ×3 IMPLANT
SUT VLOC 90 6 CV-15 VIOLET (SUTURE) ×2 IMPLANT
SUT VLOC 90 6" CV-15 VIOLET (SUTURE) ×1

## 2019-02-14 NOTE — Anesthesia Procedure Notes (Signed)
Procedure Name: Intubation Date/Time: 02/14/2019 7:11 PM Performed by: Jonna Clark, CRNA Pre-anesthesia Checklist: Patient identified, Patient being monitored, Timeout performed, Emergency Drugs available and Suction available Patient Re-evaluated:Patient Re-evaluated prior to induction Oxygen Delivery Method: Circle system utilized Preoxygenation: Pre-oxygenation with 100% oxygen Induction Type: IV induction Ventilation: Mask ventilation without difficulty Laryngoscope Size: Mac and 4 Grade View: Grade I Tube type: Oral Tube size: 7.5 mm Number of attempts: 1 Airway Equipment and Method: Stylet Placement Confirmation: ETT inserted through vocal cords under direct vision,  positive ETCO2 and breath sounds checked- equal and bilateral Secured at: 22 cm Tube secured with: Tape Dental Injury: Teeth and Oropharynx as per pre-operative assessment

## 2019-02-14 NOTE — Anesthesia Preprocedure Evaluation (Signed)
Anesthesia Evaluation  Patient identified by MRN, date of birth, ID band Patient awake    Reviewed: Allergy & Precautions, H&P , NPO status , Patient's Chart, lab work & pertinent test results  History of Anesthesia Complications Negative for: history of anesthetic complications  Airway Mallampati: II  TM Distance: >3 FB Neck ROM: full    Dental  (+) Upper Dentures, Lower Dentures   Pulmonary pneumonia, COPD, Current Smoker and Patient abstained from smoking.,           Cardiovascular Exercise Tolerance: Good hypertension, (-) angina(-) Past MI and (-) DOE      Neuro/Psych negative neurological ROS  negative psych ROS   GI/Hepatic Neg liver ROS, GERD  Medicated and Controlled,  Endo/Other  negative endocrine ROS  Renal/GU      Musculoskeletal   Abdominal   Peds  Hematology negative hematology ROS (+)   Anesthesia Other Findings Past Medical History: No date: Cancer ([place])     Comment:  skin cancer No date: COPD (chronic obstructive pulmonary disease) ([facility])     Comment:  "mild" per pt No date: GERD (gastroesophageal reflux disease) No date: Hyperlipidemia No date: Hypertension No date: Pneumonia     Comment:  resolved  Past Surgical History: No date: NASAL SEPTUM SURGERY No date: NASAL SINUS SURGERY     Reproductive/Obstetrics negative OB ROS                             Anesthesia Physical Anesthesia Plan  ASA: III  Anesthesia Plan: General ETT   Post-op Pain Management:    Induction: Intravenous  PONV Risk Score and Plan: Ondansetron, Dexamethasone, Midazolam and Treatment may vary due to age or medical condition  Airway Management Planned: Oral ETT  Additional Equipment:   Intra-op Plan:   Post-operative Plan: Extubation in OR  Informed Consent: I have reviewed the patients History and Physical, chart, labs and discussed the procedure including the risks,  benefits and alternatives for the proposed anesthesia with the patient or authorized representative who has indicated his/her understanding and acceptance.     Dental Advisory Given  Plan Discussed with: Anesthesiologist, CRNA and Surgeon  Anesthesia Plan Comments: (Patient consented for risks of anesthesia including but not limited to:  - adverse reactions to medications - damage to teeth, lips or other oral mucosa - sore throat or hoarseness - Damage to heart, brain, lungs or loss of life  Patient voiced understanding.)        Anesthesia Quick Evaluation

## 2019-02-14 NOTE — H&P (Signed)
Chief Complaint: Left inguinal hernia  History of Present Illness Kenneth Norman is a 59 y.o. male with complaint of left groin area hernia since February/March 2020.  He complains of left groin burning, bulging and progressive annoyance.  He reports this is exacerbated by changes in position and has even awakened in the middle of the night having pain in the groin.  He does note coughing or sneezing can exacerbate the pain.   typically the bulge that is present diminishes but never completely goes away in recumbent positioning.  He has no known similar pain in the right groin.  Past Medical History     Past Medical History:  Diagnosis Date  . COPD (chronic obstructive pulmonary disease) ([place])   . Hyperlipidemia            Past Surgical History:  Procedure Laterality Date  . NASAL SEPTUM SURGERY    . NASAL SINUS SURGERY      No Known Allergies        Current Outpatient Medications  Medication Sig Dispense Refill  . amLODipine (NORVASC) 10 MG tablet Take 1 tablet (10 mg total) by mouth daily. 90 tablet 1  . atorvastatin (LIPITOR) 20 MG tablet Take 1 tablet (20 mg total) by mouth daily. 90 tablet 1  . TRELEGY ELLIPTA 100-62.5-25 MCG/INH AEPB INHALE 1 INHALER INTO LUNGS DAILY 30 each 3   No current facility-administered medications for this visit.     Family History      Family History  Problem Relation Age of Onset  . Cancer Mother   . Heart disease Father       Social History Social History        Tobacco Use  . Smoking status: Current Every Day Smoker  . Smokeless tobacco: Never Used  Substance Use Topics  . Alcohol use: Yes    Comment: occasional  . Drug use: Never        Review of Systems  Constitutional: Negative.   HENT: Negative.   Eyes: Negative.   Respiratory: Negative.   Cardiovascular: Negative.   Genitourinary: Negative.   Musculoskeletal: Negative.   Skin: Negative.   Neurological: Negative.    Endo/Heme/Allergies: Negative.       Physical Exam      CONSTITUTIONAL: Well developed, and nourished, appropriately responsive and aware without distress.   EYES: Sclera non-icteric.   EARS, NOSE, MOUTH AND THROAT: Mask worn. Hearing is intact to voice.  NECK: Trachea is midline, and there is no jugular venous distension.  RESPIRATORY:  Lungs are clear, and breath sounds are equal bilaterally. Normal respiratory effort without pathologic use of accessory muscles. CARDIOVASCULAR: Heart is regular in rate and rhythm. GI: The abdomen is soft, nontender, and nondistended. There were no palpable masses. I did not appreciate hepatosplenomegaly.  GU: Bilateral testes well descended.  Obvious large asymmetry with bulge in the left groin extending into the proximal scrotum.  No appreciable right-sided hernia. MUSCULOSKELETAL:  Symmetrical muscle tone appreciated in all four extremities.    SKIN: Skin turgor is normal. No pathologic skin lesions appreciated.  NEUROLOGIC:  Motor and sensation appear grossly normal.  Cranial nerves are grossly without defect. PSYCH:  Alert and oriented to person, place and time. Affect is appropriate for situation.  Data Reviewed I have personally reviewed what is currently available of the patient's imaging, recent labs and medical records.    Assessment  Assessment   Left inguinal hernia.      Patient Active Problem List  Diagnosis Date Noted  . Allergic rhinitis 08/08/2018  . Mixed hyperlipidemia 08/02/2016  . Chronic bronchitis with COPD (chronic obstructive pulmonary disease) ([place]) 11/22/2015  . Essential hypertension 05/26/2015    Plan   Plan    Robotic left inguinal herniorrhaphy, possible bilateral. I discussed possibility of incarceration, strangulation, enlargement in size over time, and the risk of emergency surgery in the face of strangulation.  Also discussed the risk of surgery including recurrence, use of prosthetic  materials (mesh) and the risk of infxn, post-op infxn and the possible need for re-operation and removal of mesh if used, and the risks of general anesthetic, even reaction to anesthetic medications. The patient and family understands the risks, any and all questions were answered to the patient's satisfaction.    [name], M.D.

## 2019-02-14 NOTE — Transfer of Care (Signed)
Immediate Anesthesia Transfer of Care Note  Patient: Kenneth Norman  Procedure(s) Performed: XI ROBOTIC ASSISTED INGUINAL HERNIA REPAIR WITH MESH-possible bilateral (Left Abdomen)  Patient Location: PACU  Anesthesia Type:General  Level of Consciousness: drowsy and patient cooperative  Airway & Oxygen Therapy: Patient Spontanous Breathing and Patient connected to face mask oxygen  Post-op Assessment: Report given to RN and Post -op Vital signs reviewed and stable  Post vital signs: Reviewed and stable  Last Vitals:  Vitals Value Taken Time  BP 133/86 02/14/19 2044  Temp    Pulse    Resp 18 02/14/19 2044  SpO2 96 % 02/14/19 2044    Last Pain:  Vitals:   02/14/19 1158  TempSrc: Temporal  PainSc: 0-No pain         Complications: No apparent anesthesia complications

## 2019-02-14 NOTE — Discharge Instructions (Signed)
Inguinal Hernia, Adult An inguinal hernia is when fat or your intestines push through a weak spot in a muscle where your leg meets your lower belly (groin). This causes a rounded lump (bulge). This kind of hernia could also be:  In your scrotum, if you are male.  In folds of skin around your vagina, if you are male. There are three types of inguinal hernias. These include:  Hernias that can be pushed back into the belly (are reducible). This type rarely causes pain.  Hernias that cannot be pushed back into the belly (are incarcerated).  Hernias that cannot be pushed back into the belly and lose their blood supply (are strangulated). This type needs emergency surgery. If you do not have symptoms, you may not need treatment. If you have symptoms or a large hernia, you may need surgery. Follow these instructions at home: Lifestyle  Do these things if told by your doctor so you do not have trouble pooping (constipation): ? Drink enough fluid to keep your pee (urine) pale yellow. ? Eat foods that have a lot of fiber. These include fresh fruits and vegetables, whole grains, and beans. ? Limit foods that are high in fat and processed sugars. These include foods that are fried or sweet. ? Take medicine for trouble pooping.  Avoid lifting heavy objects.  Avoid standing for long amounts of time.  Do not use any products that contain nicotine or tobacco. These include cigarettes and e-cigarettes. If you need help quitting, ask your doctor.  Stay at a healthy weight. General instructions  You may try to push your hernia in by very gently pressing on it when you are lying down. Do not try to force the bulge back in if it will not push in easily.  Watch your hernia for any changes in shape, size, or color. Tell your doctor if you see any changes.  Take over-the-counter and prescription medicines only as told by your doctor.  Keep all follow-up visits as told by your doctor. This is  important. Contact a doctor if:  You have a fever.  You have new symptoms.  Your symptoms get worse. Get help right away if:  The area where your leg meets your lower belly has: ? Pain that gets worse suddenly. ? A bulge that gets bigger suddenly, and it does not get smaller after that. ? A bulge that turns red or purple. ? A bulge that is painful when you touch it.  You are a man, and your scrotum: ? Suddenly feels painful. ? Suddenly changes in size.  You cannot push the hernia in by very gently pressing on it when you are lying down. Do not try to force the bulge back in if it will not push in easily.  You feel sick to your stomach (nauseous), and that feeling does not go away.  You throw up (vomit), and that keeps happening.  You have a fast heartbeat.  You cannot poop (have a bowel movement) or pass gas. These symptoms may be an emergency. Do not wait to see if the symptoms will go away. Get medical help right away. Call your local emergency services (911 in the [place] Summary  An inguinal hernia is when fat or your intestines push through a weak spot in a muscle where your leg meets your lower belly (groin). This causes a rounded lump (bulge).  If you do not have symptoms, you may not need treatment. If you have symptoms or a large hernia, you   may need surgery.  Avoid lifting heavy objects. Also avoid standing for long amounts of time.  Do not try to force the bulge back in if it will not push in easily. This information is not intended to replace advice given to you by your health care provider. Make sure you discuss any questions you have with your health care provider. Document Revised: 02/12/2017 Document Reviewed: 10/13/2016 Elsevier Patient Education  2020 [employer] Inc[employer]   AMBULATORY SURGERY  DISCHARGE INSTRUCTIONS   1) The drugs that you were given will stay in your system until tomorrow so for the next 24 hours you should not:  A) Drive an  automobile B) Make any legal decisions C) Drink any alcoholic beverage   2) You may resume regular meals tomorrow.  Today it is better to start with liquids and gradually work up to solid foods.  You may eat anything you prefer, but it is better to start with liquids, then soup and crackers, and gradually work up to solid foods.   3) Please notify your doctor immediately if you have any unusual bleeding, trouble breathing, redness and pain at the surgery site, drainage, fever, or pain not relieved by medication.    4) Additional Instructions:        Please contact your physician with any problems or Same Day Surgery at [phone], Monday through Friday 6 am to 4 pm, or [place] [facility] [place] Main number at [phone].    

## 2019-02-14 NOTE — Interval H&P Note (Signed)
History and Physical Interval Note:  02/14/2019 2:42 PM  Kenneth Norman  has presented today for surgery, with the diagnosis of left inguinal hernia.  The various methods of treatment have been discussed with the patient and family. After consideration of risks, benefits and other options for treatment, the patient has consented to  Procedure(s): XI ROBOTIC ASSISTED INGUINAL HERNIA REPAIR WITH MESH-possible bilateral (Left) as a surgical intervention.  The patient's history has been reviewed, patient examined, no change in status, stable for surgery.  I have reviewed the patient's chart and labs.  Questions were answered to the patient's satisfaction.    Left side marked.   [name]

## 2019-02-14 NOTE — Op Note (Signed)
Robotic assisted Laparoscopic Transabdominal Left Inguinal Hernia Repair with Mesh       Pre-operative Diagnosis:  Left  Inguinal Hernia   Post-operative Diagnosis: Same   Procedure: Robotic assisted Laparoscopic  repair of left inguinal hernia   Surgeon: [name], M.D., FACS   Anesthesia: GETA   Findings: Left inguinal hernia(omental reduction necessary), no evidence of right sided hernia.         Procedure Details  The patient was seen again in the Holding Room. The benefits, complications, treatment options, and expected outcomes were discussed with the patient. The risks of bleeding, infection, recurrence of symptoms, failure to resolve symptoms, recurrence of hernia, ischemic orchitis, chronic pain syndrome or neuroma, were reviewed again. The likelihood of improving the patient's symptoms with return to their baseline status is good.  The patient and/or family concurred with the proposed plan, giving informed consent.  The patient was taken to Operating Room, identified  and the procedure verified as Laparoscopic Inguinal Hernia Repair. Laterality confirmed.  A Time Out was held and the above information confirmed.   Prior to the induction of general anesthesia, antibiotic prophylaxis was administered. VTE prophylaxis was in place. General endotracheal anesthesia was then administered and tolerated well. After the induction, the abdomen was prepped with Chloraprep and draped in the sterile fashion. The patient was positioned in the supine position.   After local infiltration of quarter percent Marcaine with epinephrine, stab incision was made left upper quadrant.  Just below the costal margin approximately midclavicular line the Veress needle is passed with sensation of the layers to penetrate the abdominal wall and into the peritoneum.  Saline drop test is confirmed peritoneal placement.  Insufflation is initiated with carbon dioxide to pressures of 15 mmHg. An 8.5 mm port is  placed to the left off of the midline, with blunt tipped trocar.  Pneumoperitoneum maintained w/o HD changes. No evidence of bowel injuries.  Two 8.5 mm ports placed under direct vision. The laparoscopy revealed large left indirect defect.   The robot was brought ot the table and docked in the standard fashion, no collision between arms was observed. Instruments were kept under direct view at all times. For left inguinal hernia repair,  I developed a peritoneal flap. The sac(s) were reduced and dissected free from adjacent structures. We preserved the vas and the vessels, and visualized them to their convergence and beyond in the retroperitoneum. Once dissection was completed a large left sided BARD 3D Light mesh was placed and secured at three points with interrupted 2-0 Vicryl to the pubic tubercle and anteriorly. There was good coverage of the direct, indirect and femoral spaces. A large angiocath is placed under direct visualization in the groin to reduce trapped extraperitoneal air and confirm adequate peritoneal closure.  Second look revealed no complications or injuries.  The flap was then closed with 2-0 V-lock suture.  Peritoneal closure without defects.  Once assuring that hemostasis was adequate, all needles/sponges removed, and the robot was undocked.  The ports were removed, the abdomen desulflated.  4-0 subcuticular Monocryl was used at all skin edges. Dermabond was placed.  Patient tolerated the procedure well. There were no complications. He was taken to the recovery room in stable condition.           [name], M.D., FACS 02/14/2019, 8:24PM

## 2019-02-15 NOTE — Anesthesia Postprocedure Evaluation (Addendum)
Anesthesia Post Note  Patient: Kenneth Norman  Procedure(s) Performed: XI ROBOTIC ASSISTED INGUINAL HERNIA REPAIR WITH MESH-possible bilateral (Left Abdomen)  Patient location during evaluation: PACU Anesthesia Type: General Level of consciousness: awake and alert Pain management: pain level controlled Vital Signs Assessment: post-procedure vital signs reviewed and stable Respiratory status: spontaneous breathing, nonlabored ventilation and respiratory function stable Cardiovascular status: blood pressure returned to baseline and stable Postop Assessment: no apparent nausea or vomiting Anesthetic complications: no     Last Vitals:  Vitals:   02/14/19 2140 02/14/19 2220  BP: (!) 147/84 (!) 149/88  Pulse: 80 88  Resp: 16 16  Temp: 36.6 C 36.6 C  SpO2: 94% 94%    Last Pain:  Vitals:   02/14/19 2220  TempSrc:   PainSc: 0-No pain                 Tera Mater

## 2019-02-22 ENCOUNTER — Ambulatory Visit (INDEPENDENT_AMBULATORY_CARE_PROVIDER_SITE_OTHER): Payer: Self-pay | Admitting: Surgery

## 2019-02-22 ENCOUNTER — Encounter: Payer: Self-pay | Admitting: Surgery

## 2019-02-22 ENCOUNTER — Other Ambulatory Visit: Payer: Self-pay

## 2019-02-22 ENCOUNTER — Encounter: Payer: Self-pay | Admitting: Emergency Medicine

## 2019-02-22 VITALS — BP 151/89 | HR 94 | Temp 95.2°F | Resp 12 | Ht 70.0 in | Wt 213.4 lb

## 2019-02-22 DIAGNOSIS — J343 Hypertrophy of nasal turbinates: Secondary | ICD-10-CM | POA: Insufficient documentation

## 2019-02-22 DIAGNOSIS — Z9889 Other specified postprocedural states: Secondary | ICD-10-CM

## 2019-02-22 DIAGNOSIS — Z8719 Personal history of other diseases of the digestive system: Secondary | ICD-10-CM | POA: Insufficient documentation

## 2019-02-22 NOTE — Progress Notes (Signed)
[facility] POST-OP OFFICE VISIT  02/22/2019  HPI: Kenneth Norman is a 59 y.o. male 8 days s/p robotic assisted inguinal hernia repair.  He has no complaints today.  Notes a small bit of left-sided pain under significant strain or severe coughing but is transient.  Otherwise has not required pain medication.  Reports no bulge or fluid collection in the left groin.  Vital signs: BP (!) 151/89   Pulse 94   Temp (!) 95.2 F (35.1 C) (Temporal)   Resp 12   Ht 5\' 10"  (1.778 m)   Wt 213 lb 6.4 oz (96.8 kg)   SpO2 95%   BMI 30.62 kg/m    Physical Exam: Constitutional: Appears well. Abdomen: Soft and nontender, there is no mass or bulge in the left groin. Skin: Incisions are clean, dry and intact.  Dermabond intact.  Assessment/Plan: This is a 59 y.o. male 8 days s/p robotic assisted left inguinal hernia repair.  Patient Active Problem List   Diagnosis Date Noted  . Hypertrophy of inferior nasal turbinate 02/22/2019  . Allergic rhinitis 08/08/2018  . Screening for malignant neoplasm of respiratory organ 11/16/2017  . Mixed hyperlipidemia 08/02/2016  . Chronic bronchitis with COPD (chronic obstructive pulmonary disease) ([place]) 11/22/2015  . Essential hypertension 05/26/2015  . Tobacco abuse 03/13/2015  . Nasal obstruction 08/24/2013  . Nasal polyps 08/24/2013    -He may return to work in approximately 1 month.  Documentation and return to work releases have been completed.  Follow-up as needed.  He will be unrestricted at that time.   [name] M.D., FACS 02/22/2019, 2:01 PM

## 2019-02-22 NOTE — Patient Instructions (Addendum)
Follow up as needed. Please call the office if you have any questions or concerns.   Laparoscopic Inguinal Hernia Repair, Adult, Care After This sheet gives you information about how to care for yourself after your procedure. Your health care provider may also give you more specific instructions. If you have problems or questions, contact your health care provider. What can I expect after the procedure? After the procedure, it is common to have:  Pain.  Swelling and bruising around the incision area.  Scrotal swelling, in men.  Some fluid or blood draining from your incisions. Follow these instructions at home: Incision care  Follow instructions from your health care provider about how to take care of your incisions. Make sure you: ? Wash your hands with soap and water before you change your bandage (dressing). If soap and water are not available, use hand sanitizer. ? Change your dressing as told by your health care provider. ? Leave stitches (sutures), skin glue, or adhesive strips in place. These skin closures may need to stay in place for 2 weeks or longer. If adhesive strip edges start to loosen and curl up, you may trim the loose edges. Do not remove adhesive strips completely unless your health care provider tells you to do that.  Check your incision area every day for signs of infection. Check for: ? More redness, swelling, or pain. ? More fluid or blood. ? Warmth. ? Pus or a bad smell.  Wear loose, soft clothing while your incisions heal. Driving  Do not drive or use heavy machinery while taking prescription pain medicine.  Do not drive for 24 hours if you were given a medicine to help you relax (sedative) during your procedure. Activity  Do not lift anything that is heavier than 10 lb (4.5 kg), or the limit that you are told, until your health care provider says that it is safe.  Ask your health care provider what activities are safe for you. A lot of activity during  the first week after surgery can increase pain and swelling. For 1 week after your procedure: ? Avoid activities that take a lot of effort, such as exercise or sports. ? You may walk and climb stairs as needed for daily activity, but avoid long walks or climbing stairs for exercise. Managing pain and swelling   Put ice on painful or swollen areas: ? Put ice in a plastic bag. ? Place a towel between your skin and the bag. ? Leave the ice on for 20 minutes, 2-3 times a day. General instructions  Do not take baths, swim, or use a hot tub until your health care provider approves. Ask your health care provider if you may take showers. You may only be allowed to take sponge baths.  Take over-the-counter and prescription medicines only as told by your health care provider.  To prevent or treat constipation while you are taking prescription pain medicine, your health care provider may recommend that you: ? Drink enough fluid to keep your urine pale yellow. ? Take over-the-counter or prescription medicines. ? Eat foods that are high in fiber, such as fresh fruits and vegetables, whole grains, and beans. ? Limit foods that are high in fat and processed sugars, such as fried and sweet foods.  Do not use any products that contain nicotine or tobacco, such as cigarettes and e-cigarettes. If you need help quitting, ask your health care provider.  Drink enough fluid to keep your urine pale yellow.  Keep all follow-up visits  as told by your health care provider. This is important. Contact a health care provider if:  You have more redness, swelling, or pain around your incisions or your groin area.  You have more swelling in your scrotum.  You have more fluid or blood coming from your incisions.  Your incisions feel warm to the touch.  You have severe pain and medicines do not help.  You have abdominal pain or swelling.  You cannot eat or drink without vomiting.  You cannot urinate or pass  a bowel movement.  You faint.  You feel dizzy.  You have nausea and vomiting.  You have a fever. Get help right away if:  You have pus or a bad smell coming from your incisions.  You have redness, warmth, or pain in your leg.  You have chest pain.  You have problems breathing. Summary  Pain, swelling, and bruising are common after the procedure.  Check your incision area every day for signs of infection, such as more redness, swelling, or pain.  Put ice on painful or swollen areas for 20 minutes, 2-3 times a day. This information is not intended to replace advice given to you by your health care provider. Make sure you discuss any questions you have with your health care provider. Document Revised: 06/21/2018 Document Reviewed: 04/22/2016 Elsevier Patient Education  [place] PATIENT INSTRUCTIONS   WOUND CARE INSTRUCTIONS:  Keep a dry clean dressing on the wound if there is drainage. The initial bandage may be removed after 24 hours.  Once the wound has quit draining you may leave it open to air.  If clothing rubs against the wound or causes irritation and the wound is not draining you may cover it with a dry dressing during the daytime.  Try to keep the wound dry and avoid ointments on the wound unless directed to do so.  If the wound becomes bright red and painful or starts to drain infected material that is not clear, please contact your physician immediately.  If the wound is mildly pink and has a thick firm ridge underneath it, this is normal, and is referred to as a healing ridge.  This will resolve over the next 4-6 weeks.  BATHING: You may shower if you have been informed of this by your surgeon. However, Please do not submerge in a tub, hot tub, or pool until incisions are completely sealed or have been told by your surgeon that you may do so.  DIET:  You may eat any foods that you can tolerate.  It is a good idea to eat a high fiber  diet and take in plenty of fluids to prevent constipation.  If you do become constipated you may want to take a mild laxative or take ducolax tablets on a daily basis until your bowel habits are regular.  Constipation can be very uncomfortable, along with straining, after recent surgery.  ACTIVITY:  You are encouraged to cough and deep breath or use your incentive spirometer if you were given one, every 15-30 minutes when awake.  This will help prevent respiratory complications and low grade fevers post-operatively if you had a general anesthetic.  You may want to hug a pillow when coughing and sneezing to add additional support to the surgical area, if you had abdominal or chest surgery, which will decrease pain during these times.  You are encouraged to walk and engage in light activity for the next two weeks.  You should not  lift more than 20 pounds, until 03/28/2019 as it could put you at increased risk for complications.  Twenty pounds is roughly equivalent to a plastic bag of groceries. At that time- Listen to your body when lifting, if you have pain when lifting, stop and then try again in a few days. Soreness after doing exercises or activities of daily living is normal as you get back in to your normal routine.  MEDICATIONS:  Try to take narcotic medications and anti-inflammatory medications, such as tylenol, ibuprofen, naprosyn, etc., with food.  This will minimize stomach upset from the medication.  Should you develop nausea and vomiting from the pain medication, or develop a rash, please discontinue the medication and contact your physician.  You should not drive, make important decisions, or operate machinery when taking narcotic pain medication.  SUNBLOCK Use sun block to incision area over the next year if this area will be exposed to sun. This helps decrease scarring and will allow you avoid a permanent darkened area over your incision.  QUESTIONS:  Please feel free to call our office if you  have any questions, and we will be glad to assist you.

## 2019-03-02 ENCOUNTER — Telehealth: Payer: Self-pay

## 2019-03-02 NOTE — Telephone Encounter (Signed)
Called informed 03/06/2019 virtual visit. [facility]

## 2019-03-05 ENCOUNTER — Telehealth: Payer: Self-pay | Admitting: *Deleted

## 2019-03-05 NOTE — Telephone Encounter (Signed)
Faxed FMLA/Short Term Disability to [phone]

## 2019-03-06 ENCOUNTER — Ambulatory Visit: Payer: Commercial Managed Care - PPO | Admitting: Adult Health

## 2019-03-06 ENCOUNTER — Encounter: Payer: Self-pay | Admitting: Adult Health

## 2019-03-06 VITALS — BP 160/97 | HR 78 | Temp 96.5°F | Ht 70.0 in | Wt 203.0 lb

## 2019-03-06 DIAGNOSIS — K4090 Unilateral inguinal hernia, without obstruction or gangrene, not specified as recurrent: Secondary | ICD-10-CM | POA: Diagnosis not present

## 2019-03-06 DIAGNOSIS — I10 Essential (primary) hypertension: Secondary | ICD-10-CM | POA: Diagnosis not present

## 2019-03-06 DIAGNOSIS — F17200 Nicotine dependence, unspecified, uncomplicated: Secondary | ICD-10-CM

## 2019-03-06 MED ORDER — LOSARTAN POTASSIUM 25 MG PO TABS: 25.0000 mg | ORAL_TABLET | Freq: Every day | ORAL | 0 refills | Status: DC

## 2019-03-06 NOTE — Progress Notes (Signed)
[facility] [place], [place] [address]  Internal MEDICINE  Telephone Visit  Patient Name: Kenneth Norman  [id number]  [id number]  Date of Service: 03/06/2019  I connected with the patient at 903 by telephone and verified the patients identity using two identifiers.   I discussed the limitations, risks, security and privacy concerns of performing an evaluation and management service by telephone and the availability of in person appointments. I also discussed with the patient that there may be a patient responsible charge related to the service.  The patient expressed understanding and agrees to proceed.    Chief Complaint  Patient presents with  . Telephone Assessment  . Telephone Screen  . Hypertension    HPI  PT is seen via video. Pt has had surgical repair of hernia since last visit.  He has done very well with this.  He is still out of work, but only because he job requires heavy lifting. He is trying to lose weight currently, because he has gained around 20 pounds since covid.  His blood pressure is elevated today, and he feels the main culprit is his weight gain.  He currently takes Norvasc 10mg  daily.  Denies Chest pain, Shortness of breath, palpitations, headache, or blurred vision.    Current Medication: Outpatient Encounter Medications as of 03/06/2019  Medication Sig  . amLODipine (NORVASC) 10 MG tablet Take 1 tablet (10 mg total) by mouth daily. (Patient taking differently: Take 10 mg by mouth at bedtime. )  . Aspirin-Acetaminophen-Caffeine (EXCEDRIN PO) Take 1 tablet by mouth as needed.  Marland Kitchen atorvastatin (LIPITOR) 20 MG tablet Take 1 tablet (20 mg total) by mouth daily. (Patient taking differently: Take 20 mg by mouth daily at 6 PM. )  . ibuprofen (ADVIL) 800 MG tablet Take 1 tablet (800 mg total) by mouth every 8 (eight) hours as needed.  Marland Kitchen omeprazole (PRILOSEC OTC) 20 MG tablet Take 20 mg by mouth as needed.  . TRELEGY ELLIPTA 100-62.5-25 MCG/INH  AEPB INHALE 1 INHALER INTO LUNGS DAILY (Patient taking differently: Inhale 1 puff into the lungs every morning. )  . HYDROcodone-acetaminophen (NORCO) 5-325 MG tablet Take 2 tablets by mouth every 6 (six) hours as needed for moderate pain. (Patient not taking: Reported on 03/06/2019)  . losartan (COZAAR) 25 MG tablet Take 1 tablet (25 mg total) by mouth daily.   No facility-administered encounter medications on file as of 03/06/2019.    Surgical History: Past Surgical History:  Procedure Laterality Date  . NASAL SEPTUM SURGERY    . NASAL SINUS SURGERY    . XI ROBOTIC ASSISTED INGUINAL HERNIA REPAIR WITH MESH Left 02/14/2019   Procedure: XI ROBOTIC ASSISTED INGUINAL HERNIA REPAIR WITH MESH-possible bilateral;  Surgeon: [name], MD;  Location: [facility] ORS;  Service: General;  Laterality: Left;    Medical History: Past Medical History:  Diagnosis Date  . Cancer ([place])    skin cancer  . COPD (chronic obstructive pulmonary disease) ([place])    "mild" per pt  . GERD (gastroesophageal reflux disease)   . Hyperlipidemia   . Hypertension   . Pneumonia    resolved    Family History: Family History  Problem Relation Age of Onset  . Cancer Mother   . Heart disease Father     Social History   Socioeconomic History  . Marital status: Married    Spouse name: Not on file  . Number of children: Not on file  . Years of education: Not on file  .  Highest education level: Not on file  Occupational History  . Not on file  Tobacco Use  . Smoking status: Current Every Day Smoker    Packs/day: 1.50    Years: 40.00    Pack years: 60.00    Types: Cigarettes  . Smokeless tobacco: Never Used  Substance and Sexual Activity  . Alcohol use: Yes    Comment: occasional  . Drug use: Never  . Sexual activity: Not on file  Other Topics Concern  . Not on file  Social History Narrative  . Not on file   Social Determinants of Health   Financial Resource Strain:   . Difficulty of Paying Living  Expenses: Not on file  Food Insecurity:   . Worried About Charity fundraiser in the Last Year: Not on file  . Ran Out of Food in the Last Year: Not on file  Transportation Needs:   . Lack of Transportation (Medical): Not on file  . Lack of Transportation (Non-Medical): Not on file  Physical Activity:   . Days of Exercise per Week: Not on file  . Minutes of Exercise per Session: Not on file  Stress:   . Feeling of Stress : Not on file  Social Connections:   . Frequency of Communication with Friends and Family: Not on file  . Frequency of Social Gatherings with Friends and Family: Not on file  . Attends Religious Services: Not on file  . Active Member of Clubs or Organizations: Not on file  . Attends Archivist Meetings: Not on file  . Marital Status: Not on file  Intimate Partner Violence:   . Fear of Current or Ex-Partner: Not on file  . Emotionally Abused: Not on file  . Physically Abused: Not on file  . Sexually Abused: Not on file      Review of Systems  Constitutional: Negative.  Negative for chills, fatigue and unexpected weight change.  HENT: Negative.  Negative for congestion, rhinorrhea, sneezing and sore throat.   Eyes: Negative for redness.  Respiratory: Negative.  Negative for cough, chest tightness and shortness of breath.   Cardiovascular: Negative.  Negative for chest pain and palpitations.  Gastrointestinal: Negative.  Negative for abdominal pain, constipation, diarrhea, nausea and vomiting.  Endocrine: Negative.   Genitourinary: Negative.  Negative for dysuria and frequency.  Musculoskeletal: Negative.  Negative for arthralgias, back pain, joint swelling and neck pain.  Skin: Negative.  Negative for rash.  Allergic/Immunologic: Negative.   Neurological: Negative.  Negative for tremors and numbness.  Hematological: Negative for adenopathy. Does not bruise/bleed easily.  Psychiatric/Behavioral: Negative.  Negative for behavioral problems, sleep  disturbance and suicidal ideas. The patient is not nervous/anxious.     Vital Signs: BP (!) 160/97   Pulse 78   Temp (!) 96.5 F (35.8 C)   Ht 5\' 10"  (1.778 m)   Wt 203 lb (92.1 kg)   BMI 29.13 kg/m    Observation/Objective:  Well appearing, NAD noted.    Assessment/Plan: 1. Essential hypertension Pressure is elevated today, only on norvac.  Add Losartan 25mg  daily.  Follow up as scheduled.   2. Nicotine dependence with current use Smoking cessation counseling: 1. Pt acknowledges the risks of long term smoking, she will try to quite smoking. 2. Options for different medications including nicotine products, chewing gum, patch etc, Wellbutrin and Chantix is discussed 3. Goal and date of compete cessation is discussed 4. Total time spent in smoking cessation is 15 min.  3. Left groin  hernia Recently repaired, recovering well.   General Counseling: Desjuan verbalizes understanding of the findings of today's phone visit and agrees with plan of treatment. I have discussed any further diagnostic evaluation that may be needed or ordered today. We also reviewed his medications today. he has been encouraged to call the office with any questions or concerns that should arise related to todays visit.    No orders of the defined types were placed in this encounter.   Meds ordered this encounter  Medications  . losartan (COZAAR) 25 MG tablet    Sig: Take 1 tablet (25 mg total) by mouth daily.    Dispense:  30 tablet    Refill:  0    Time spent: [place] AGNP-C Internal medicine

## 2019-03-13 ENCOUNTER — Other Ambulatory Visit: Payer: Self-pay

## 2019-03-13 ENCOUNTER — Ambulatory Visit (INDEPENDENT_AMBULATORY_CARE_PROVIDER_SITE_OTHER): Payer: Self-pay | Admitting: Surgery

## 2019-03-13 ENCOUNTER — Encounter: Payer: Self-pay | Admitting: Surgery

## 2019-03-13 VITALS — BP 134/85 | HR 87 | Temp 97.5°F | Resp 14 | Ht 70.0 in | Wt 211.4 lb

## 2019-03-13 DIAGNOSIS — Z8719 Personal history of other diseases of the digestive system: Secondary | ICD-10-CM

## 2019-03-13 DIAGNOSIS — Z9889 Other specified postprocedural states: Secondary | ICD-10-CM

## 2019-03-13 NOTE — Patient Instructions (Addendum)
Dr.Rodenberg discussed with patient that the pain he is experiencing, is all a part of the healing process due to the sutures from the hernia repair surgery. Also advised patient that it may take a few months to completely heal due to pressure of the cough/sneezing.  Follow-up with our office as needed.  Please call and ask to speak with a nurse if you develop questions or concerns.

## 2019-03-13 NOTE — Progress Notes (Signed)
[facility] POST-OP OFFICE VISIT  03/13/2019  HPI: Kenneth Norman is a 59 y.o. male 25 days s/p robotic left inguinal hernia repair.  He is approaching his time to return to work, but still has transient sharp pain with coughing or sneezing.  He reports that if he positions or brings his knees that he is without pain.  Had not utilized all of his pain medications and is not using any pain medications at all currently.  He confirms the pain is very brief and only at the point of sneezing or coughing.  Vital signs: BP 134/85   Pulse 87   Temp (!) 97.5 F (36.4 C) (Temporal)   Resp 14   Ht 5\' 10"  (1.778 m)   Wt 211 lb 6.4 oz (95.9 kg)   SpO2 95%   BMI 30.33 kg/m    Physical Exam: Constitutional: Appears well, no distress no evidence of discomfort. Abdomen: Soft and benign, no bulges no masses in groin area.  Confirms that he focal area of pain is limited to the pubis.  Denies any other pain anywhere on the abdominal wall, groin, left anterior thigh or medial thigh or left scrotum. Skin: Intact.  Assessment/Plan: This is a 59 y.o. male 25 days s/p left robotic inguinal hernia repair.  Patient Active Problem List   Diagnosis Date Noted  . Hypertrophy of inferior nasal turbinate 02/22/2019  . Status post laparoscopic hernia repair 02/22/2019  . Allergic rhinitis 08/08/2018  . Screening for malignant neoplasm of respiratory organ 11/16/2017  . Mixed hyperlipidemia 08/02/2016  . Chronic bronchitis with COPD (chronic obstructive pulmonary disease) ([place]) 11/22/2015  . Essential hypertension 05/26/2015  . Tobacco abuse 03/13/2015  . Nasal obstruction 08/24/2013  . Nasal polyps 08/24/2013    -Reassurance is given that wound strength and healing will be sufficient to warrant beginning his work.  We are well aware he does have some heavy lifting to do on the job.  We are hopeful that lifting while not precipitate the same pain that sneezing and coughing does.  We discussed  remaining in touch when he resumes work and as he does not have any pain at any other time I suspect he will be okay.  We remain available should any concerns arise as he initiates/resumes his job.   [name] M.D., FACS 03/13/2019, 11:55 AM

## 2019-03-16 ENCOUNTER — Telehealth: Payer: Self-pay | Admitting: *Deleted

## 2019-03-16 NOTE — Telephone Encounter (Signed)
Patient called stated that HR did not receive FMLA so I faxed it again to [phone]

## 2019-04-19 ENCOUNTER — Other Ambulatory Visit: Payer: Self-pay | Admitting: Adult Health

## 2019-05-11 ENCOUNTER — Encounter: Payer: Self-pay | Admitting: *Deleted

## 2019-05-30 ENCOUNTER — Other Ambulatory Visit: Payer: Self-pay | Admitting: Internal Medicine

## 2019-05-30 DIAGNOSIS — J438 Other emphysema: Secondary | ICD-10-CM

## 2019-05-31 ENCOUNTER — Telehealth: Payer: Self-pay

## 2019-05-31 NOTE — Telephone Encounter (Signed)
Confirmed appointment on 06/04/2019 and screened for covid. kl[facility]

## 2019-06-04 ENCOUNTER — Ambulatory Visit: Payer: Commercial Managed Care - PPO | Admitting: Adult Health

## 2019-06-04 ENCOUNTER — Encounter: Payer: Self-pay | Admitting: Adult Health

## 2019-06-04 ENCOUNTER — Other Ambulatory Visit: Payer: Self-pay

## 2019-06-04 VITALS — BP 133/87 | HR 85 | Temp 97.6°F | Resp 16 | Ht 70.0 in | Wt 197.0 lb

## 2019-06-04 DIAGNOSIS — I10 Essential (primary) hypertension: Secondary | ICD-10-CM | POA: Diagnosis not present

## 2019-06-04 DIAGNOSIS — F17200 Nicotine dependence, unspecified, uncomplicated: Secondary | ICD-10-CM | POA: Diagnosis not present

## 2019-06-04 DIAGNOSIS — J438 Other emphysema: Secondary | ICD-10-CM

## 2019-06-04 DIAGNOSIS — E782 Mixed hyperlipidemia: Secondary | ICD-10-CM | POA: Diagnosis not present

## 2019-06-04 DIAGNOSIS — K4090 Unilateral inguinal hernia, without obstruction or gangrene, not specified as recurrent: Secondary | ICD-10-CM

## 2019-06-04 MED ORDER — ATORVASTATIN CALCIUM 20 MG PO TABS: 20.0000 mg | ORAL_TABLET | Freq: Every day | ORAL | 1 refills | Status: DC

## 2019-06-04 MED ORDER — LOSARTAN POTASSIUM 25 MG PO TABS: 25.0000 mg | ORAL_TABLET | Freq: Every day | ORAL | 3 refills | Status: DC

## 2019-06-04 MED ORDER — TRELEGY ELLIPTA 100-62.5-25 MCG/INH IN AEPB: INHALATION_SPRAY | RESPIRATORY_TRACT | 3 refills | Status: DC

## 2019-06-04 MED ORDER — AMLODIPINE BESYLATE 10 MG PO TABS: 10.0000 mg | ORAL_TABLET | Freq: Every day | ORAL | 3 refills | Status: DC

## 2019-06-04 NOTE — Progress Notes (Signed)
[facility] [place], [place] [address]  Internal MEDICINE  Office Visit Note  Patient Name: Kenneth Norman  [id number]  [id number]  Date of Service: 06/04/2019  Chief Complaint  Patient presents with  . Hypertension  . Gastroesophageal Reflux  . Hyperlipidemia    HPI  Pt is here for follow up on HTN, GERD and HLD.  He reports he had his inguinal hernia repaired in janurary.  He has gained about 15 pounds since surgery, but has managed to lose 10 pounds.  He denies any pain or need at this time.  He denies any symptoms of GERD.  His bp is well controlled.  Denies Chest pain, Shortness of breath, palpitations, headache, or blurred vision.    Current Medication: Outpatient Encounter Medications as of 06/04/2019  Medication Sig  . amLODipine (NORVASC) 10 MG tablet Take 1 tablet (10 mg total) by mouth at bedtime.  . Aspirin-Acetaminophen-Caffeine (EXCEDRIN PO) Take 1 tablet by mouth as needed.  Marland Kitchen atorvastatin (LIPITOR) 20 MG tablet Take 1 tablet (20 mg total) by mouth daily.  . Fluticasone-Umeclidin-Vilant (TRELEGY ELLIPTA) 100-62.5-25 MCG/INH AEPB INHALE 1 PUFF INTO LUNGS ONCE DAILY  . losartan (COZAAR) 25 MG tablet Take 1 tablet (25 mg total) by mouth daily.  Marland Kitchen omeprazole (PRILOSEC OTC) 20 MG tablet Take 20 mg by mouth as needed.  . [DISCONTINUED] amLODipine (NORVASC) 10 MG tablet Take 1 tablet (10 mg total) by mouth daily. (Patient taking differently: Take 10 mg by mouth at bedtime. )  . [DISCONTINUED] atorvastatin (LIPITOR) 20 MG tablet Take 1 tablet (20 mg total) by mouth daily. (Patient taking differently: Take 20 mg by mouth daily at 6 PM. )  . [DISCONTINUED] losartan (COZAAR) 25 MG tablet Take 1 tablet by mouth once daily  . [DISCONTINUED] TRELEGY ELLIPTA 100-62.5-25 MCG/INH AEPB INHALE 1 PUFF INTO LUNGS ONCE DAILY  . [DISCONTINUED] HYDROcodone-acetaminophen (NORCO) 5-325 MG tablet Take 2 tablets by mouth every 6 (six) hours as needed for moderate pain.  (Patient not taking: Reported on 03/06/2019)  . [DISCONTINUED] ibuprofen (ADVIL) 800 MG tablet Take 1 tablet (800 mg total) by mouth every 8 (eight) hours as needed. (Patient not taking: Reported on 03/13/2019)   No facility-administered encounter medications on file as of 06/04/2019.    Surgical History: Past Surgical History:  Procedure Laterality Date  . NASAL SEPTUM SURGERY    . NASAL SINUS SURGERY    . XI ROBOTIC ASSISTED INGUINAL HERNIA REPAIR WITH MESH Left 02/14/2019   Procedure: XI ROBOTIC ASSISTED INGUINAL HERNIA REPAIR WITH MESH-possible bilateral;  Surgeon: [name], MD;  Location: [facility] ORS;  Service: General;  Laterality: Left;    Medical History: Past Medical History:  Diagnosis Date  . Cancer ([place])    skin cancer  . COPD (chronic obstructive pulmonary disease) ([place])    "mild" per pt  . GERD (gastroesophageal reflux disease)   . Hyperlipidemia   . Hypertension   . Pneumonia    resolved    Family History: Family History  Problem Relation Age of Onset  . Cancer Mother   . Heart disease Father     Social History   Socioeconomic History  . Marital status: Married    Spouse name: Not on file  . Number of children: Not on file  . Years of education: Not on file  . Highest education level: Not on file  Occupational History  . Not on file  Tobacco Use  . Smoking status: Current Every Day Smoker  Packs/day: 1.50    Years: 40.00    Pack years: 60.00    Types: Cigarettes  . Smokeless tobacco: Never Used  Substance and Sexual Activity  . Alcohol use: Yes    Comment: occasional  . Drug use: Never  . Sexual activity: Not on file  Other Topics Concern  . Not on file  Social History Narrative  . Not on file   Social Determinants of Health   Financial Resource Strain:   . Difficulty of Paying Living Expenses:   Food Insecurity:   . Worried About Charity fundraiser in the Last Year:   . Arboriculturist in the Last Year:   Transportation Needs:    . Film/video editor (Medical):   Marland Kitchen Lack of Transportation (Non-Medical):   Physical Activity:   . Days of Exercise per Week:   . Minutes of Exercise per Session:   Stress:   . Feeling of Stress :   Social Connections:   . Frequency of Communication with Friends and Family:   . Frequency of Social Gatherings with Friends and Family:   . Attends Religious Services:   . Active Member of Clubs or Organizations:   . Attends Archivist Meetings:   Marland Kitchen Marital Status:   Intimate Partner Violence:   . Fear of Current or Ex-Partner:   . Emotionally Abused:   Marland Kitchen Physically Abused:   . Sexually Abused:       Review of Systems  Constitutional: Negative.  Negative for chills, fatigue and unexpected weight change.  HENT: Negative.  Negative for congestion, rhinorrhea, sneezing and sore throat.   Eyes: Negative for redness.  Respiratory: Negative.  Negative for cough, chest tightness and shortness of breath.   Cardiovascular: Negative.  Negative for chest pain and palpitations.  Gastrointestinal: Negative.  Negative for abdominal pain, constipation, diarrhea, nausea and vomiting.  Endocrine: Negative.   Genitourinary: Negative.  Negative for dysuria and frequency.  Musculoskeletal: Negative.  Negative for arthralgias, back pain, joint swelling and neck pain.  Skin: Negative.  Negative for rash.  Allergic/Immunologic: Negative.   Neurological: Negative.  Negative for tremors and numbness.  Hematological: Negative for adenopathy. Does not bruise/bleed easily.  Psychiatric/Behavioral: Negative.  Negative for behavioral problems, sleep disturbance and suicidal ideas. The patient is not nervous/anxious.     Vital Signs: BP 133/87   Pulse 85   Temp 97.6 F (36.4 C)   Resp 16   Ht 5\' 10"  (1.778 m)   Wt 197 lb (89.4 kg) Comment: weighed at home, has on steel toed boot  SpO2 97%   BMI 28.27 kg/m    Physical Exam Vitals and nursing note reviewed.  Constitutional:       General: He is not in acute distress.    Appearance: He is well-developed. He is not diaphoretic.  HENT:     Head: Normocephalic and atraumatic.     Mouth/Throat:     Pharynx: No oropharyngeal exudate.  Eyes:     Pupils: Pupils are equal, round, and reactive to light.  Neck:     Thyroid: No thyromegaly.     Vascular: No JVD.     Trachea: No tracheal deviation.  Cardiovascular:     Rate and Rhythm: Normal rate and regular rhythm.     Heart sounds: Normal heart sounds. No murmur. No friction rub. No gallop.   Pulmonary:     Effort: Pulmonary effort is normal. No respiratory distress.     Breath sounds: Normal  breath sounds. No wheezing or rales.  Chest:     Chest wall: No tenderness.  Abdominal:     Palpations: Abdomen is soft.     Tenderness: There is no abdominal tenderness. There is no guarding.  Musculoskeletal:        General: Normal range of motion.     Cervical back: Normal range of motion and neck supple.  Lymphadenopathy:     Cervical: No cervical adenopathy.  Skin:    General: Skin is warm and dry.  Neurological:     Mental Status: He is alert and oriented to person, place, and time.     Cranial Nerves: No cranial nerve deficit.  Psychiatric:        Behavior: Behavior normal.        Thought Content: Thought content normal.        Judgment: Judgment normal.     Assessment/Plan: 1. Essential hypertension Continue to take medications as prescribed. - losartan (COZAAR) 25 MG tablet; Take 1 tablet (25 mg total) by mouth daily.  Dispense: 30 tablet; Refill: 3 - amLODipine (NORVASC) 10 MG tablet; Take 1 tablet (10 mg total) by mouth at bedtime.  Dispense: 30 tablet; Refill: 3  2. Other emphysema ([place]) Stable, continue to use Trelegy as directed . - Fluticasone-Umeclidin-Vilant (TRELEGY ELLIPTA) 100-62.5-25 MCG/INH AEPB; INHALE 1 PUFF INTO LUNGS ONCE DAILY  Dispense: 60 each; Refill: 3  3. Mixed hyperlipidemia Stable, continue to use Lipitor as directed.  -  atorvastatin (LIPITOR) 20 MG tablet; Take 1 tablet (20 mg total) by mouth daily.  Dispense: 90 tablet; Refill: 1  4. Nicotine dependence with current use Smoking cessation counseling: 1. Pt acknowledges the risks of long term smoking, she will try to quite smoking. 2. Options for different medications including nicotine products, chewing gum, patch etc, Wellbutrin and Chantix is discussed 3. Goal and date of compete cessation is discussed 4. Total time spent in smoking cessation is 15 min.  5. Left groin hernia Repaired in january.   General Counseling: Jimmylee verbalizes understanding of the findings of todays visit and agrees with plan of treatment. I have discussed any further diagnostic evaluation that may be needed or ordered today. We also reviewed his medications today. he has been encouraged to call the office with any questions or concerns that should arise related to todays visit.    No orders of the defined types were placed in this encounter.   Meds ordered this encounter  Medications  . losartan (COZAAR) 25 MG tablet    Sig: Take 1 tablet (25 mg total) by mouth daily.    Dispense:  30 tablet    Refill:  3  . amLODipine (NORVASC) 10 MG tablet    Sig: Take 1 tablet (10 mg total) by mouth at bedtime.    Dispense:  30 tablet    Refill:  3  . Fluticasone-Umeclidin-Vilant (TRELEGY ELLIPTA) 100-62.5-25 MCG/INH AEPB    Sig: INHALE 1 PUFF INTO LUNGS ONCE DAILY    Dispense:  60 each    Refill:  3  . atorvastatin (LIPITOR) 20 MG tablet    Sig: Take 1 tablet (20 mg total) by mouth daily.    Dispense:  90 tablet    Refill:  1    Time spent: 30 Minutes   This patient was seen by Orson Gear AGNP-C in Collaboration with Dr Lavera Guise as a part of collaborative care agreement     Kendell Bane AGNP-C Internal medicine

## 2019-06-29 ENCOUNTER — Telehealth: Payer: Self-pay

## 2019-06-29 NOTE — Telephone Encounter (Signed)
Patient believed to have gotten bit by a mosquito a few days ago under his armpit and now the area is very swollen and looks infected. Advised patient to go to urgent care.

## 2019-07-30 ENCOUNTER — Other Ambulatory Visit: Payer: Self-pay | Admitting: Adult Health

## 2019-07-30 DIAGNOSIS — I10 Essential (primary) hypertension: Secondary | ICD-10-CM

## 2019-12-03 ENCOUNTER — Ambulatory Visit: Payer: Commercial Managed Care - PPO | Admitting: Adult Health

## 2019-12-18 ENCOUNTER — Encounter: Payer: Self-pay | Admitting: Hospice and Palliative Medicine

## 2019-12-18 ENCOUNTER — Ambulatory Visit (INDEPENDENT_AMBULATORY_CARE_PROVIDER_SITE_OTHER): Payer: Commercial Managed Care - PPO | Admitting: Hospice and Palliative Medicine

## 2019-12-18 ENCOUNTER — Other Ambulatory Visit: Payer: Self-pay

## 2019-12-18 DIAGNOSIS — E782 Mixed hyperlipidemia: Secondary | ICD-10-CM

## 2019-12-18 DIAGNOSIS — I10 Essential (primary) hypertension: Secondary | ICD-10-CM

## 2019-12-18 DIAGNOSIS — Z0001 Encounter for general adult medical examination with abnormal findings: Secondary | ICD-10-CM | POA: Diagnosis not present

## 2019-12-18 DIAGNOSIS — Z122 Encounter for screening for malignant neoplasm of respiratory organs: Secondary | ICD-10-CM

## 2019-12-18 DIAGNOSIS — Z125 Encounter for screening for malignant neoplasm of prostate: Secondary | ICD-10-CM | POA: Diagnosis not present

## 2019-12-18 DIAGNOSIS — F17219 Nicotine dependence, cigarettes, with unspecified nicotine-induced disorders: Secondary | ICD-10-CM

## 2019-12-18 DIAGNOSIS — R300 Dysuria: Secondary | ICD-10-CM

## 2019-12-18 MED ORDER — LOSARTAN POTASSIUM-HCTZ 50-12.5 MG PO TABS: 1.0000 | ORAL_TABLET | Freq: Every day | ORAL | 3 refills | Status: DC

## 2019-12-18 NOTE — Progress Notes (Signed)
[facility] [place], [place] [address]  Internal MEDICINE  Office Visit Note  Patient Name: Kenneth Norman  [id number]  [id number]  Date of Service: 12/23/2019  Chief Complaint  Patient presents with  . Annual Exam  . Follow-up    6 month fup  . Quality Metric Gaps    HIV screen, Hep C screen, Covid vacc, Flu vacc  . controlled substance policy    acknowledged     HPI Pt is here for routine health maintenance examination Continues to smoke about 2 pack of cigarettes per day Has changed jobs, is now driving a truck--drinks less but smokes more since he is in his truck a lot of the time Has been having issues controlling his BP--home readings are similar to today's office visit reading, reports DBP averages 95-100--denies chest pain, shortness of breath or headaches Takes his medications as prescribed--does not routinely miss doses Using Trelegy inhaler for COPD with good results  PHM: Colonoscopy: 2016, normal, repeat 2026  Current Medication: Outpatient Encounter Medications as of 12/18/2019  Medication Sig  . amLODipine (NORVASC) 10 MG tablet Take 1 tablet (10 mg total) by mouth at bedtime.  . Aspirin-Acetaminophen-Caffeine (EXCEDRIN PO) Take 1 tablet by mouth as needed.  Marland Kitchen atorvastatin (LIPITOR) 20 MG tablet Take 1 tablet (20 mg total) by mouth daily.  . Fluticasone-Umeclidin-Vilant (TRELEGY ELLIPTA) 100-62.5-25 MCG/INH AEPB INHALE 1 PUFF INTO LUNGS ONCE DAILY  . losartan (COZAAR) 25 MG tablet Take 1 tablet by mouth once daily  . omeprazole (PRILOSEC OTC) 20 MG tablet Take 20 mg by mouth as needed.  Marland Kitchen losartan-hydrochlorothiazide (HYZAAR) 50-12.5 MG tablet Take 1 tablet by mouth daily.   No facility-administered encounter medications on file as of 12/18/2019.    Surgical History: Past Surgical History:  Procedure Laterality Date  . NASAL SEPTUM SURGERY    . NASAL SINUS SURGERY    . XI ROBOTIC ASSISTED INGUINAL HERNIA REPAIR WITH MESH  Left 02/14/2019   Procedure: XI ROBOTIC ASSISTED INGUINAL HERNIA REPAIR WITH MESH-possible bilateral;  Surgeon: [name], MD;  Location: [facility] ORS;  Service: General;  Laterality: Left;    Medical History: Past Medical History:  Diagnosis Date  . Cancer ([place])    skin cancer  . COPD (chronic obstructive pulmonary disease) ([place])    "mild" per pt  . GERD (gastroesophageal reflux disease)   . Hyperlipidemia   . Hypertension   . Pneumonia    resolved    Family History: Family History  Problem Relation Age of Onset  . Cancer Mother   . Heart disease Father       Review of Systems  Constitutional: Negative for chills, fatigue and unexpected weight change.  HENT: Negative for congestion, postnasal drip, rhinorrhea, sneezing and sore throat.   Eyes: Negative for redness.  Respiratory: Negative for cough, chest tightness and shortness of breath.   Cardiovascular: Negative for chest pain, palpitations and leg swelling.  Gastrointestinal: Negative for abdominal pain, constipation, diarrhea, nausea and vomiting.  Endocrine: Negative for polydipsia, polyphagia and polyuria.  Genitourinary: Negative for dysuria and frequency.  Musculoskeletal: Negative for arthralgias, back pain, joint swelling and neck pain.  Skin: Negative for rash.  Neurological: Negative for dizziness, tremors, light-headedness, numbness and headaches.  Hematological: Negative for adenopathy. Does not bruise/bleed easily.  Psychiatric/Behavioral: Negative for behavioral problems (Depression), sleep disturbance and suicidal ideas. The patient is not nervous/anxious.      Vital Signs: BP (!) 142/100   Pulse 76   Temp  97.9 F (36.6 C)   Resp 16   Ht 5\' 10"  (1.778 m)   Wt 202 lb 6.4 oz (91.8 kg)   SpO2 98%   BMI 29.04 kg/m    Physical Exam Vitals reviewed.  Constitutional:      Appearance: Normal appearance. He is normal weight.  HENT:     Right Ear: Tympanic membrane normal.     Left Ear:  Tympanic membrane normal.     Nose: Nose normal.     Mouth/Throat:     Mouth: Mucous membranes are moist.     Pharynx: Oropharynx is clear.  Eyes:     Pupils: Pupils are equal, round, and reactive to light.  Cardiovascular:     Rate and Rhythm: Normal rate and regular rhythm.     Pulses: Normal pulses.     Heart sounds: Normal heart sounds.  Pulmonary:     Effort: Pulmonary effort is normal.     Breath sounds: Normal breath sounds.  Abdominal:     General: Abdomen is flat.     Palpations: Abdomen is soft.  Musculoskeletal:        General: Normal range of motion.     Cervical back: Normal range of motion.  Skin:    General: Skin is warm.  Neurological:     General: No focal deficit present.     Mental Status: He is alert and oriented to person, place, and time. Mental status is at baseline.  Psychiatric:        Mood and Affect: Mood normal.        Behavior: Behavior normal.        Thought Content: Thought content normal.      LABS: Recent Results (from the past 2160 hour(s))  UA/M w/rflx Culture, Routine     Status: None   Collection Time: 12/18/19  9:21 AM   Specimen: Urine   Urine  Result Value Ref Range   Specific Gravity, UA 1.007 1.005 - 1.030   pH, UA 7.0 5.0 - 7.5   Color, UA Yellow Yellow   Appearance Ur Clear Clear   Leukocytes,UA Negative Negative   Protein,UA Negative Negative/Trace   Glucose, UA Negative Negative   Ketones, UA Negative Negative   RBC, UA Negative Negative   Bilirubin, UA Negative Negative   Urobilinogen, Ur 0.2 0.2 - 1.0 mg/dL   Nitrite, UA Negative Negative   Microscopic Examination Comment     Comment: Microscopic follows if indicated.   Microscopic Examination See below:     Comment: Microscopic was indicated and was performed.   Urinalysis Reflex Comment     Comment: This specimen will not reflex to a Urine Culture.  Microscopic Examination     Status: None   Collection Time: 12/18/19  9:21 AM   Urine  Result Value Ref Range    WBC, UA None seen 0 - 5 /hpf   RBC None seen 0 - 2 /hpf   Epithelial Cells (non renal) None seen 0 - 10 /hpf   Casts None seen None seen /lpf   Bacteria, UA None seen None seen/Few     Assessment/Plan: 1. Encounter for routine adult health examination with abnormal findings Well appearing 59 year old male, up to date on PHM Will review routine labs and adjust plan of care as needed - CBC w/Diff/Platelet - Comprehensive Metabolic Panel (CMET) - Lipid Panel With LDL/HDL Ratio - TSH + free T4  2. Screening for prostate cancer - PSA  3. Essential  hypertension Add HCTZ to therapy for better BP control Will need to come by office for BP check in 1-2 weeks May need echocardiogram - losartan-hydrochlorothiazide (HYZAAR) 50-12.5 MG tablet; Take 1 tablet by mouth daily.  Dispense: 90 tablet; Refill: 3  4. Mixed hyperlipidemia Continue with atorvastatin at this time, will review updated lipid panel and adjust therapy as indicated - Lipid Panel With LDL/HDL Ratio  5. Cigarette nicotine dependence with nicotine-induced disorder Encouraged to cut back on smoking habits Will need PFT  6. Encounter for screening for lung cancer Will need low dose chest CT   7. Dysuria - UA/M w/rflx Culture, Routine - Microscopic Examination  General Counseling: Brayen verbalizes understanding of the findings of todays visit and agrees with plan of treatment. I have discussed any further diagnostic evaluation that may be needed or ordered today. We also reviewed his medications today. he has been encouraged to call the office with any questions or concerns that should arise related to todays visit.    Counseling: Smoking cessation counseling: 1. Pt acknowledges the risks of long term smoking, she will try to quite smoking. 2. Options for different medications including nicotine products, chewing gum, patch etc, Wellbutrin and Chantix is discussed 3. Goal and date of compete cessation is  discussed 4. Total time spent in smoking cessation is 15 min.  Orders Placed This Encounter  Procedures  . Microscopic Examination  . UA/M w/rflx Culture, Routine  . CBC w/Diff/Platelet  . Comprehensive Metabolic Panel (CMET)  . Lipid Panel With LDL/HDL Ratio  . TSH + free T4  . PSA    Meds ordered this encounter  Medications  . losartan-hydrochlorothiazide (HYZAAR) 50-12.5 MG tablet    Sig: Take 1 tablet by mouth daily.    Dispense:  90 tablet    Refill:  3    Total time spent: 35 Minutes  Time spent includes review of chart, medications, test results, and follow up plan with the patient.   This patient was seen by Casey Burkitt AGNP-C Collaboration with Dr Lavera Guise as a part of collaborative care agreement   Tanna Furry. [facility] Internal Medicine

## 2019-12-19 ENCOUNTER — Other Ambulatory Visit: Payer: Self-pay

## 2019-12-19 DIAGNOSIS — F17219 Nicotine dependence, cigarettes, with unspecified nicotine-induced disorders: Secondary | ICD-10-CM

## 2019-12-19 LAB — UA/M W/RFLX CULTURE, ROUTINE
Bilirubin, UA: NEGATIVE
Glucose, UA: NEGATIVE
Ketones, UA: NEGATIVE
Leukocytes,UA: NEGATIVE
Nitrite, UA: NEGATIVE
Protein,UA: NEGATIVE
RBC, UA: NEGATIVE
Specific Gravity, UA: 1.007 (ref 1.005–1.030)
Urobilinogen, Ur: 0.2 mg/dL (ref 0.2–1.0)
pH, UA: 7 (ref 5.0–7.5)

## 2019-12-19 LAB — MICROSCOPIC EXAMINATION
Bacteria, UA: NONE SEEN
Casts: NONE SEEN /lpf
Epithelial Cells (non renal): NONE SEEN /hpf (ref 0–10)
RBC, Urine: NONE SEEN /hpf (ref 0–2)
WBC, UA: NONE SEEN /hpf (ref 0–5)

## 2019-12-23 ENCOUNTER — Encounter: Payer: Self-pay | Admitting: Hospice and Palliative Medicine

## 2020-01-02 ENCOUNTER — Ambulatory Visit: Payer: Commercial Managed Care - PPO

## 2020-03-10 ENCOUNTER — Other Ambulatory Visit: Payer: Self-pay

## 2020-03-10 ENCOUNTER — Telehealth: Payer: Self-pay | Admitting: *Deleted

## 2020-03-10 ENCOUNTER — Ambulatory Visit
Admission: RE | Admit: 2020-03-10 | Discharge: 2020-03-10 | Disposition: A | Discharge status: Home / Self Care | Payer: Commercial Managed Care - PPO | Source: Ambulatory Visit | Attending: Hospice and Palliative Medicine | Admitting: Hospice and Palliative Medicine

## 2020-03-10 DIAGNOSIS — F17200 Nicotine dependence, unspecified, uncomplicated: Secondary | ICD-10-CM

## 2020-03-10 DIAGNOSIS — F17219 Nicotine dependence, cigarettes, with unspecified nicotine-induced disorders: Secondary | ICD-10-CM

## 2020-03-10 DIAGNOSIS — Z122 Encounter for screening for malignant neoplasm of respiratory organs: Secondary | ICD-10-CM

## 2020-03-10 DIAGNOSIS — Z87891 Personal history of nicotine dependence: Secondary | ICD-10-CM

## 2020-03-10 NOTE — Telephone Encounter (Signed)
Received referral for initial lung cancer screening scan. Contacted patient and obtained smoking history,(current, 60 pack year) as well as answering questions related to screening process. Patient denies signs of lung cancer such as weight loss or hemoptysis. Patient denies comorbidity that would prevent curative treatment if lung cancer were found. Patient is scheduled for shared decision making visit virtuall and CT scan on 06/16/20 at 8am.

## 2020-03-30 ENCOUNTER — Other Ambulatory Visit: Payer: Self-pay | Admitting: Adult Health

## 2020-03-30 DIAGNOSIS — J438 Other emphysema: Secondary | ICD-10-CM

## 2020-05-05 ENCOUNTER — Other Ambulatory Visit: Payer: Self-pay

## 2020-05-05 DIAGNOSIS — J438 Other emphysema: Secondary | ICD-10-CM

## 2020-05-05 MED ORDER — TRELEGY ELLIPTA 100-62.5-25 MCG/INH IN AEPB: INHALATION_SPRAY | RESPIRATORY_TRACT | 1 refills | Status: DC

## 2020-05-24 ENCOUNTER — Other Ambulatory Visit: Payer: Self-pay | Admitting: Adult Health

## 2020-05-24 DIAGNOSIS — E782 Mixed hyperlipidemia: Secondary | ICD-10-CM

## 2020-05-26 ENCOUNTER — Other Ambulatory Visit: Payer: Self-pay | Admitting: Adult Health

## 2020-05-26 DIAGNOSIS — E782 Mixed hyperlipidemia: Secondary | ICD-10-CM

## 2020-05-29 ENCOUNTER — Other Ambulatory Visit: Payer: Self-pay | Admitting: Nurse Practitioner

## 2020-05-29 DIAGNOSIS — E782 Mixed hyperlipidemia: Secondary | ICD-10-CM

## 2020-05-29 MED ORDER — ATORVASTATIN CALCIUM 20 MG PO TABS: 20.0000 mg | ORAL_TABLET | Freq: Every day | ORAL | 0 refills | Status: DC

## 2020-06-13 ENCOUNTER — Other Ambulatory Visit: Payer: Self-pay

## 2020-06-13 ENCOUNTER — Inpatient Hospital Stay: Payer: Commercial Managed Care - PPO | Attending: Oncology | Admitting: Oncology

## 2020-06-13 DIAGNOSIS — Z122 Encounter for screening for malignant neoplasm of respiratory organs: Secondary | ICD-10-CM | POA: Diagnosis not present

## 2020-06-13 DIAGNOSIS — F17210 Nicotine dependence, cigarettes, uncomplicated: Secondary | ICD-10-CM | POA: Diagnosis not present

## 2020-06-13 DIAGNOSIS — Z87891 Personal history of nicotine dependence: Secondary | ICD-10-CM

## 2020-06-13 NOTE — Progress Notes (Signed)
Virtual Visit via Video Note  I connected with  on 06/13/20 at  1:30 PM EDT by a video enabled telemedicine application and verified that I am speaking with the correct person using two identifiers.  Location: Patient: Home Provider: Clinic   I discussed the limitations of evaluation and management by telemedicine and the availability of in person appointments. The patient expressed understanding and agreed to proceed.  I discussed the assessment and treatment plan with the patient. The patient was provided an opportunity to ask questions and all were answered. The patient agreed with the plan and demonstrated an understanding of the instructions.   The patient was advised to call back or seek an in-person evaluation if the symptoms worsen or if the condition fails to improve as anticipated.   In accordance with CMS guidelines, patient has met eligibility criteria including age, absence of signs or symptoms of lung cancer.  Social History   Tobacco Use  . Smoking status: Current Every Day Smoker    Packs/day: 1.50    Years: 40.00    Pack years: 60.00    Types: Cigarettes  . Smokeless tobacco: Never Used  Vaping Use  . Vaping Use: Never used  Substance Use Topics  . Alcohol use: Yes    Comment: occasional  . Drug use: Never      A shared decision-making session was conducted prior to the performance of CT scan. This includes one or more decision aids, includes benefits and harms of screening, follow-up diagnostic testing, over-diagnosis, false positive rate, and total radiation exposure.   Counseling on the importance of adherence to annual lung cancer LDCT screening, impact of co-morbidities, and ability or willingness to undergo diagnosis and treatment is imperative for compliance of the program.   Counseling on the importance of continued smoking cessation for former smokers; the importance of smoking cessation for current smokers, and information about tobacco cessation  interventions have been given to patient including [place] and 1800 quit [place] programs.   Written order for lung cancer screening with LDCT has been given to the patient and any and all questions have been answered to the best of my abilities.    Yearly follow up will be coordinated by Burgess Estelle, Thoracic Navigator.  I provided 15 minutes of face-to-face video visit time during this encounter, and > 50% was spent counseling as documented under my assessment & plan.   Jacquelin Hawking, NP

## 2020-06-16 ENCOUNTER — Other Ambulatory Visit: Payer: Self-pay

## 2020-06-16 ENCOUNTER — Ambulatory Visit: Payer: Commercial Managed Care - PPO | Admitting: Physician Assistant

## 2020-06-16 ENCOUNTER — Encounter: Payer: Self-pay | Admitting: Physician Assistant

## 2020-06-16 ENCOUNTER — Ambulatory Visit
Admission: RE | Admit: 2020-06-16 | Discharge: 2020-06-16 | Disposition: A | Discharge status: Home / Self Care | Payer: Commercial Managed Care - PPO | Source: Ambulatory Visit | Attending: Oncology | Admitting: Oncology

## 2020-06-16 DIAGNOSIS — F17200 Nicotine dependence, unspecified, uncomplicated: Secondary | ICD-10-CM | POA: Insufficient documentation

## 2020-06-16 DIAGNOSIS — F17219 Nicotine dependence, cigarettes, with unspecified nicotine-induced disorders: Secondary | ICD-10-CM

## 2020-06-16 DIAGNOSIS — J438 Other emphysema: Secondary | ICD-10-CM | POA: Diagnosis not present

## 2020-06-16 DIAGNOSIS — E782 Mixed hyperlipidemia: Secondary | ICD-10-CM

## 2020-06-16 DIAGNOSIS — Z122 Encounter for screening for malignant neoplasm of respiratory organs: Secondary | ICD-10-CM | POA: Diagnosis present

## 2020-06-16 DIAGNOSIS — M25511 Pain in right shoulder: Secondary | ICD-10-CM

## 2020-06-16 DIAGNOSIS — I10 Essential (primary) hypertension: Secondary | ICD-10-CM

## 2020-06-16 DIAGNOSIS — G8929 Other chronic pain: Secondary | ICD-10-CM

## 2020-06-16 DIAGNOSIS — Z87891 Personal history of nicotine dependence: Secondary | ICD-10-CM | POA: Diagnosis present

## 2020-06-16 MED ORDER — MELOXICAM 15 MG PO TABS: 15.0000 mg | ORAL_TABLET | Freq: Every day | ORAL | 0 refills | Status: DC

## 2020-06-16 NOTE — Progress Notes (Signed)
[facility] [place], [place] [address]  Internal MEDICINE  Office Visit Note  Patient Name: Kenneth Norman  [id number]  [id number]  Date of Service: 06/17/2020  Chief Complaint  Patient presents with  . Follow-up    Right shoulder hurts down to elbow, started about 3 to 4 months ago  . Hyperlipidemia  . Hypertension  . Gastroesophageal Reflux  . COPD    HPI Pt is here for routine follow up -4-5 months ago started having right shoulder pain. Thought he slept on it wrong. Hurts more in the AM, worse with reaching out. Pain from shoulder down to elbow. Has not tried anything for it.  -BP is doing great. -He is still smoking and not ready to quit at this time. 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 atorvastatin (LIPITOR) 20 MG tablet Take 1 tablet (20 mg total) by mouth daily.  . Fluticasone-Umeclidin-Vilant (TRELEGY ELLIPTA) 100-62.5-25 MCG/INH AEPB INHALE 1 PUFF INTO LUNGS ONCE DAILY  . losartan-hydrochlorothiazide (HYZAAR) 50-12.5 MG tablet Take 1 tablet by mouth daily.  . meloxicam (MOBIC) 15 MG tablet Take 1 tablet (15 mg total) by mouth daily.  Marland Kitchen omeprazole (PRILOSEC OTC) 20 MG tablet Take 20 mg by mouth as needed.  . [DISCONTINUED] amLODipine (NORVASC) 10 MG tablet Take 1 tablet (10 mg total) by mouth at bedtime. (Patient not taking: Reported on 06/16/2020)  . [DISCONTINUED] Aspirin-Acetaminophen-Caffeine (EXCEDRIN PO) Take 1 tablet by mouth as needed. (Patient not taking: Reported on 06/16/2020)  . [DISCONTINUED] losartan (COZAAR) 25 MG tablet Take 1 tablet by mouth once daily (Patient not taking: Reported on 06/16/2020)   No facility-administered encounter medications on file as of 06/16/2020.    Surgical History: Past Surgical History:  Procedure  Laterality Date  . NASAL SEPTUM SURGERY    . NASAL SINUS SURGERY    . XI ROBOTIC ASSISTED INGUINAL HERNIA REPAIR WITH MESH Left 02/14/2019   Procedure: XI ROBOTIC ASSISTED INGUINAL HERNIA REPAIR WITH MESH-possible bilateral;  Surgeon: [name], MD;  Location: [facility] ORS;  Service: General;  Laterality: Left;    Medical History: Past Medical History:  Diagnosis Date  . Cancer ([place])    skin cancer  . COPD (chronic obstructive pulmonary disease) ([place])    "mild" per pt  . GERD (gastroesophageal reflux disease)   . Hyperlipidemia   . Hypertension   . Pneumonia    resolved    Family History: Family History  Problem Relation Age of Onset  . Cancer Mother   . Heart disease Father     Social History   Socioeconomic History  . Marital status: Married    Spouse name: Not on file  . Number of children: Not on file  . Years of education: Not on file  . Highest education level: Not on file  Occupational History  . Not on file  Tobacco Use  . Smoking status: Current Every Day Smoker    Packs/day: 1.50    Years: 40.00    Pack years: 60.00    Types: Cigarettes  . Smokeless tobacco: Never Used  Vaping Use  . Vaping Use: Never used  Substance and Sexual Activity  . Alcohol use: Yes    Comment: occasional  . Drug use: Never  . Sexual activity:  Not on file  Other Topics Concern  . Not on file  Social History Narrative  . Not on file   Social Determinants of Health   Financial Resource Strain: Not on file  Food Insecurity: Not on file  Transportation Needs: Not on file  Physical Activity: Not on file  Stress: Not on file  Social Connections: Not on file  Intimate Partner Violence: Not on file      Review of Systems  Constitutional: Negative for chills, fatigue and unexpected weight change.  HENT: Negative for congestion, postnasal drip, rhinorrhea, sneezing and sore throat.   Eyes: Negative for redness.  Respiratory: Negative for cough, chest tightness and  shortness of breath.   Cardiovascular: Negative for chest pain and palpitations.  Gastrointestinal: Negative for abdominal pain, constipation, diarrhea, nausea and vomiting.  Genitourinary: Negative for dysuria and frequency.  Musculoskeletal: Positive for arthralgias. Negative for back pain, joint swelling and neck pain.       Right shoulder pain  Skin: Negative for rash.  Neurological: Negative.  Negative for tremors and numbness.  Hematological: Negative for adenopathy. Does not bruise/bleed easily.  Psychiatric/Behavioral: Negative for behavioral problems (Depression), sleep disturbance and suicidal ideas. The patient is not nervous/anxious.     Vital Signs: BP 126/90   Pulse 80   Temp (!) 97.4 F (36.3 C)   Resp 16   Ht 5\' 10"  (1.778 m)   Wt 203 lb (92.1 kg)   SpO2 97%   BMI 29.13 kg/m    Physical Exam Vitals and nursing note reviewed.  Constitutional:      General: He is not in acute distress.    Appearance: He is well-developed. He is not diaphoretic.  HENT:     Head: Normocephalic and atraumatic.     Mouth/Throat:     Pharynx: No oropharyngeal exudate.  Eyes:     Pupils: Pupils are equal, round, and reactive to light.  Neck:     Thyroid: No thyromegaly.     Vascular: No JVD.     Trachea: No tracheal deviation.  Cardiovascular:     Rate and Rhythm: Normal rate and regular rhythm.     Heart sounds: Normal heart sounds. No murmur heard. No friction rub. No gallop.   Pulmonary:     Effort: Pulmonary effort is normal. No respiratory distress.     Breath sounds: No wheezing or rales.  Chest:     Chest wall: No tenderness.  Abdominal:     General: Bowel sounds are normal.     Palpations: Abdomen is soft.  Musculoskeletal:        General: No tenderness. Normal range of motion.     Cervical back: Normal range of motion and neck supple.     Comments: R shoulder: Negative empty can, +hawkins impingement; Non-TTP  Lymphadenopathy:     Cervical: No cervical  adenopathy.  Skin:    General: Skin is warm and dry.  Neurological:     Mental Status: He is alert and oriented to person, place, and time.     Cranial Nerves: No cranial nerve deficit.  Psychiatric:        Behavior: Behavior normal.        Thought Content: Thought content normal.        Judgment: Judgment normal.        Assessment/Plan: 1. Chronic right shoulder pain Will start on meloxicam to help with pain and inflammation. Xray ordered, however pt will wait to go if not improving on meloxicam.  Consider ortho/PT referral if no improvement. Patient does state that with his work he would have difficulty completing PT and would likely not be able to have surgery if indicated - meloxicam (MOBIC) 15 MG tablet; Take 1 tablet (15 mg total) by mouth daily.  Dispense: 30 tablet; Refill: 0 - DG Shoulder Right  2. Essential hypertension Well controlled, continue hyzaar  3. Mixed hyperlipidemia Continue lipitor  4. Other emphysema ([place]) Continue trelegy; low dose CT done today but no results at this time  5. Cigarette nicotine dependence with nicotine-induced disorder Pt not ready to quit--low dose Ct chest done today but no results at this time Smoking cessation counseling: 1. Pt acknowledges the risks of long term smoking, she will try to quite smoking. 2. Options for different medications including nicotine products, chewing gum, patch etc, Wellbutrin and Chantix is discussed 3. Goal and date of compete cessation is discussed 4. Total time spent in smoking cessation is 10 min.    General Counseling: Dantae verbalizes understanding of the findings of todays visit and agrees with plan of treatment. I have discussed any further diagnostic evaluation that may be needed or ordered today. We also reviewed his medications today. he has been encouraged to call the office with any questions or concerns that should arise related to todays visit.    Orders Placed This Encounter  Procedures   . DG Shoulder Right    Meds ordered this encounter  Medications  . meloxicam (MOBIC) 15 MG tablet    Sig: Take 1 tablet (15 mg total) by mouth daily.    Dispense:  30 tablet    Refill:  0    This patient was seen by Drema Dallas, PA-C in collaboration with Dr. Clayborn Bigness as a part of collaborative care agreement.   Total time spent:30 Minutes Time spent includes review of chart, medications, test results, and follow up plan with the patient.      Dr Lavera Guise Internal medicine

## 2020-06-17 NOTE — Patient Instructions (Signed)
Chronic Obstructive Pulmonary Disease  Chronic obstructive pulmonary disease (COPD) is a long-term (chronic) lung problem. When you have COPD, it is hard for air to get in and out of your lungs. Usually the condition gets worse over time, and your lungs will never return to normal. There are things you can do to keep yourself as healthy as possible. What are the causes?  Smoking. This is the most common cause.  Certain genes passed from parent to child (inherited). What increases the risk?  Being exposed to secondhand smoke from cigarettes, pipes, or cigars.  Being exposed to chemicals and other irritants, such as fumes and dust in the work environment.  Having chronic lung conditions or infections. What are the signs or symptoms?  Shortness of breath, especially during physical activity.  A long-term cough with a large amount of thick mucus. Sometimes, the cough may not have any mucus (dry cough).  Wheezing.  Breathing quickly.  Skin that looks gray or blue, especially in the fingers, toes, or lips.  Feeling tired (fatigue).  Weight loss.  Chest tightness.  Having infections often.  Episodes when breathing symptoms become much worse (exacerbations). At the later stages of this disease, you may have swelling in the ankles, feet, or legs. How is this treated?  Taking medicines.  Quitting smoking, if you smoke.  Rehabilitation. This includes steps to make your body work better. It may involve a team of specialists.  Doing exercises.  Making changes to your diet.  Using oxygen.  Lung surgery.  Lung transplant.  Comfort measures (palliative care). Follow these instructions at home: Medicines  Take over-the-counter and prescription medicines only as told by your doctor.  Talk to your doctor before taking any cough or allergy medicines. You may need to avoid medicines that cause your lungs to be dry. Lifestyle  If you smoke, stop smoking. Smoking makes the  problem worse.  Do not smoke or use any products that contain nicotine or tobacco. If you need help quitting, ask your doctor.  Avoid being around things that make your breathing worse. This may include smoke, chemicals, and fumes.  Stay active, but remember to rest as well.  Learn and use tips on how to manage stress and control your breathing.  Make sure you get enough sleep. Most adults need at least 7 hours of sleep every night.  Eat healthy foods. Eat smaller meals more often. Rest before meals. Controlled breathing Learn and use tips on how to control your breathing as told by your doctor. Try:  Breathing in (inhaling) through your nose for 1 second. Then, pucker your lips and breath out (exhale) through your lips for 2 seconds.  Putting one hand on your belly (abdomen). Breathe in slowly through your nose for 1 second. Your hand on your belly should move out. Pucker your lips and breathe out slowly through your lips. Your hand on your belly should move in as you breathe out.   Controlled coughing Learn and use controlled coughing to clear mucus from your lungs. Follow these steps: 1. Lean your head a little forward. 2. Breathe in deeply. 3. Try to hold your breath for 3 seconds. 4. Keep your mouth slightly open while coughing 2 times. 5. Spit any mucus out into a tissue. 6. Rest and do the steps again 1 or 2 times as needed. General instructions  Make sure you get all the shots (vaccines) that your doctor recommends. Ask your doctor about a flu shot and a pneumonia shot.    Use oxygen therapy and pulmonary rehabilitation if told by your doctor. If you need home oxygen therapy, ask your doctor if you should buy a tool to measure your oxygen level (oximeter).  Make a COPD action plan with your doctor. This helps you to know what to do if you feel worse than usual.  Manage any other conditions you have as told by your doctor.  Avoid going outside when it is very hot, cold, or  humid.  Avoid people who have a sickness you can catch (contagious).  Keep all follow-up visits. Contact a doctor if:  You cough up more mucus than usual.  There is a change in the color or thickness of the mucus.  It is harder to breathe than usual.  Your breathing is faster than usual.  You have trouble sleeping.  You need to use your medicines more often than usual.  You have trouble doing your normal activities such as getting dressed or walking around the house. Get help right away if:  You have shortness of breath while resting.  You have shortness of breath that stops you from: ? Being able to talk. ? Doing normal activities.  Your chest hurts for longer than 5 minutes.  Your skin color is more blue than usual.  Your pulse oximeter shows that you have low oxygen for longer than 5 minutes.  You have a fever.  You feel too tired to breathe normally. These symptoms may represent a serious problem that is an emergency. Do not wait to see if the symptoms will go away. Get medical help right away. Call your local emergency services (911 in the [place] Do not drive yourself to the hospital. Summary  Chronic obstructive pulmonary disease (COPD) is a long-term lung problem.  The way your lungs work will never return to normal. Usually the condition gets worse over time. There are things you can do to keep yourself as healthy as possible.  Take over-the-counter and prescription medicines only as told by your doctor.  If you smoke, stop. Smoking makes the problem worse. This information is not intended to replace advice given to you by your health care provider. Make sure you discuss any questions you have with your health care provider. Document Revised: 11/20/2019 Document Reviewed: 11/20/2019 Elsevier Patient Education  2021 [employer]   

## 2020-06-26 ENCOUNTER — Telehealth: Payer: Self-pay | Admitting: *Deleted

## 2020-06-26 NOTE — Telephone Encounter (Signed)
Notified patient of LDCT lung cancer screening program results with recommendation for 6 month follow up imaging. Also notified of incidental findings noted below and is encouraged to discuss further with PCP who will receive a copy of this note and/or the CT report. Patient verbalizes understanding.   IMPRESSION: 1. Pulmonary nodules measure up to 7.0 mm along the minor fissure. Lung-RADS 3, probably benign findings. Short-term follow-up in 6 months is recommended with repeat low-dose chest CT without contrast (please use the following order, "CT CHEST LCS NODULE FOLLOW-UP W/O CM"). These results will be called to the ordering clinician or representative by the Radiologist Assistant, and communication documented in the PACS or [employer]. 2. Coronary artery calcification. 3.  Emphysema (ICD10-J43.9).

## 2020-07-07 ENCOUNTER — Other Ambulatory Visit: Payer: Self-pay | Admitting: Adult Health

## 2020-07-07 DIAGNOSIS — I10 Essential (primary) hypertension: Secondary | ICD-10-CM

## 2020-07-09 ENCOUNTER — Other Ambulatory Visit: Payer: Self-pay

## 2020-07-09 DIAGNOSIS — M25511 Pain in right shoulder: Secondary | ICD-10-CM

## 2020-07-09 DIAGNOSIS — I10 Essential (primary) hypertension: Secondary | ICD-10-CM

## 2020-07-09 DIAGNOSIS — E782 Mixed hyperlipidemia: Secondary | ICD-10-CM

## 2020-07-09 MED ORDER — MELOXICAM 15 MG PO TABS: 15.0000 mg | ORAL_TABLET | Freq: Every day | ORAL | 0 refills | Status: DC

## 2020-07-09 MED ORDER — OMEPRAZOLE MAGNESIUM 20 MG PO TBEC: 20.0000 mg | DELAYED_RELEASE_TABLET | ORAL | 1 refills | Status: DC | PRN

## 2020-07-09 MED ORDER — LOSARTAN POTASSIUM-HCTZ 50-12.5 MG PO TABS: 1.0000 | ORAL_TABLET | Freq: Every day | ORAL | 3 refills | Status: DC

## 2020-07-09 MED ORDER — ATORVASTATIN CALCIUM 20 MG PO TABS: 20.0000 mg | ORAL_TABLET | Freq: Every day | ORAL | 1 refills | Status: DC

## 2020-07-11 ENCOUNTER — Other Ambulatory Visit: Payer: Self-pay

## 2020-07-11 DIAGNOSIS — G8929 Other chronic pain: Secondary | ICD-10-CM

## 2020-07-12 ENCOUNTER — Other Ambulatory Visit: Payer: Self-pay | Admitting: Nurse Practitioner

## 2020-07-12 ENCOUNTER — Other Ambulatory Visit: Payer: Self-pay | Admitting: Physician Assistant

## 2020-07-12 DIAGNOSIS — E782 Mixed hyperlipidemia: Secondary | ICD-10-CM

## 2020-07-12 DIAGNOSIS — G8929 Other chronic pain: Secondary | ICD-10-CM

## 2020-07-29 ENCOUNTER — Other Ambulatory Visit: Payer: Self-pay

## 2020-07-29 DIAGNOSIS — J438 Other emphysema: Secondary | ICD-10-CM

## 2020-07-29 MED ORDER — TRELEGY ELLIPTA 100-62.5-25 MCG/INH IN AEPB: INHALATION_SPRAY | RESPIRATORY_TRACT | 1 refills | Status: DC

## 2020-07-31 ENCOUNTER — Other Ambulatory Visit: Payer: Self-pay

## 2020-07-31 DIAGNOSIS — J438 Other emphysema: Secondary | ICD-10-CM

## 2020-07-31 MED ORDER — TRELEGY ELLIPTA 100-62.5-25 MCG/INH IN AEPB: INHALATION_SPRAY | RESPIRATORY_TRACT | 1 refills | Status: DC

## 2020-12-22 ENCOUNTER — Other Ambulatory Visit: Payer: Self-pay

## 2020-12-22 ENCOUNTER — Ambulatory Visit (INDEPENDENT_AMBULATORY_CARE_PROVIDER_SITE_OTHER): Payer: Commercial Managed Care - PPO | Admitting: Nurse Practitioner

## 2020-12-22 ENCOUNTER — Encounter: Payer: Self-pay | Admitting: Nurse Practitioner

## 2020-12-22 VITALS — BP 118/80 | HR 84 | Temp 98.5°F | Resp 16 | Ht 70.0 in | Wt 216.6 lb

## 2020-12-22 DIAGNOSIS — E782 Mixed hyperlipidemia: Secondary | ICD-10-CM | POA: Diagnosis not present

## 2020-12-22 DIAGNOSIS — M25511 Pain in right shoulder: Secondary | ICD-10-CM

## 2020-12-22 DIAGNOSIS — J441 Chronic obstructive pulmonary disease with (acute) exacerbation: Secondary | ICD-10-CM

## 2020-12-22 DIAGNOSIS — Z0001 Encounter for general adult medical examination with abnormal findings: Secondary | ICD-10-CM | POA: Diagnosis not present

## 2020-12-22 DIAGNOSIS — J438 Other emphysema: Secondary | ICD-10-CM | POA: Diagnosis not present

## 2020-12-22 DIAGNOSIS — R300 Dysuria: Secondary | ICD-10-CM

## 2020-12-22 DIAGNOSIS — I10 Essential (primary) hypertension: Secondary | ICD-10-CM | POA: Diagnosis not present

## 2020-12-22 DIAGNOSIS — K219 Gastro-esophageal reflux disease without esophagitis: Secondary | ICD-10-CM

## 2020-12-22 DIAGNOSIS — Z23 Encounter for immunization: Secondary | ICD-10-CM

## 2020-12-22 DIAGNOSIS — G8929 Other chronic pain: Secondary | ICD-10-CM

## 2020-12-22 MED ORDER — ATORVASTATIN CALCIUM 20 MG PO TABS: 20.0000 mg | ORAL_TABLET | Freq: Every day | ORAL | 3 refills | Status: DC

## 2020-12-22 MED ORDER — METHOCARBAMOL 500 MG PO TABS: 500.0000 mg | ORAL_TABLET | Freq: Four times a day (QID) | ORAL | 0 refills | Status: DC

## 2020-12-22 MED ORDER — LEVOFLOXACIN 750 MG PO TABS
750.0000 mg | ORAL_TABLET | Freq: Every day | ORAL | 0 refills | Status: AC
Start: 2020-12-22 — End: 2020-12-29

## 2020-12-22 MED ORDER — OMEPRAZOLE MAGNESIUM 20 MG PO TBEC: 20.0000 mg | DELAYED_RELEASE_TABLET | Freq: Every day | ORAL | 3 refills | Status: AC

## 2020-12-22 MED ORDER — METHYLPREDNISOLONE 4 MG PO TBPK: ORAL_TABLET | ORAL | 0 refills | Status: DC

## 2020-12-22 MED ORDER — PNEUMOCOCCAL 20-VAL CONJ VACC 0.5 ML IM SUSY: 0.5000 mL | PREFILLED_SYRINGE | Freq: Once | INTRAMUSCULAR | 0 refills | Status: AC

## 2020-12-22 MED ORDER — TRELEGY ELLIPTA 100-62.5-25 MCG/ACT IN AEPB: 1.0000 | INHALATION_SPRAY | Freq: Every day | RESPIRATORY_TRACT | 11 refills | Status: DC

## 2020-12-22 MED ORDER — LOSARTAN POTASSIUM-HCTZ 50-12.5 MG PO TABS: 1.0000 | ORAL_TABLET | Freq: Every day | ORAL | 3 refills | Status: DC

## 2020-12-22 NOTE — Progress Notes (Signed)
[facility] [place], [place] [address]  Internal MEDICINE  Office Visit Note  Patient Name: Kenneth Norman  [id number]  [id number]  Date of Service: 12/22/2020  Chief Complaint  Patient presents with   Annual Exam    Right shoulder pain    Gastroesophageal Reflux   Hypertension   Hyperlipidemia   COPD   Pneumonia    HPI Kenneth Norman presents for an annual well visit and physical exam. He is a well-appearing 60 yo male. He declined annual labs and stated he will get them next year. He has  hypertension, hyperlipidemia, GERD and COPD. He takes trelegy for COPD. Atorvastatin for hyperlipidemia and losartan-HCTZ for BP. His GERD is stable with omeprazole.  He is a cross country Administrator. He has chronic right shoulder pain.  He has no other questions or concerns.     Current Medication: Outpatient Encounter Medications as of 12/22/2020  Medication Sig   Fluticasone-Umeclidin-Vilant (TRELEGY ELLIPTA) 100-62.5-25 MCG/ACT AEPB Inhale 1 puff into the lungs daily.   [EXPIRED] levofloxacin (LEVAQUIN) 750 MG tablet Take 1 tablet (750 mg total) by mouth daily for 7 days.   methocarbamol (ROBAXIN) 500 MG tablet Take 1 tablet (500 mg total) by mouth 4 (four) times daily.   methylPREDNISolone (MEDROL DOSEPAK) 4 MG TBPK tablet Take as directed   [EXPIRED] pneumococcal 20-valent conjugate vaccine (PREVNAR 20) 0.5 ML injection Inject 0.5 mLs into the muscle once for 1 dose.   [DISCONTINUED] atorvastatin (LIPITOR) 20 MG tablet Take 1 tablet (20 mg total) by mouth daily.   [DISCONTINUED] Fluticasone-Umeclidin-Vilant (TRELEGY ELLIPTA) 100-62.5-25 MCG/INH AEPB INHALE 1 PUFF INTO LUNGS ONCE DAILY   [DISCONTINUED] losartan-hydrochlorothiazide (HYZAAR) 50-12.5 MG tablet Take 1 tablet by mouth daily.   [DISCONTINUED] omeprazole (PRILOSEC OTC) 20 MG tablet Take 1 tablet (20 mg total) by mouth as needed.   atorvastatin (LIPITOR) 20 MG tablet Take 1 tablet (20 mg total) by mouth  daily.   losartan-hydrochlorothiazide (HYZAAR) 50-12.5 MG tablet Take 1 tablet by mouth daily.   omeprazole (PRILOSEC OTC) 20 MG tablet Take 1 tablet (20 mg total) by mouth daily.   [DISCONTINUED] meloxicam (MOBIC) 15 MG tablet Take 1 tablet by mouth once daily (Patient not taking: Reported on 12/22/2020)   No facility-administered encounter medications on file as of 12/22/2020.    Surgical History: Past Surgical History:  Procedure Laterality Date   NASAL SEPTUM SURGERY     NASAL SINUS SURGERY     XI ROBOTIC ASSISTED INGUINAL HERNIA REPAIR WITH MESH Left 02/14/2019   Procedure: XI ROBOTIC ASSISTED INGUINAL HERNIA REPAIR WITH MESH-possible bilateral;  Surgeon: [name], MD;  Location: [facility] ORS;  Service: General;  Laterality: Left;    Medical History: Past Medical History:  Diagnosis Date   Cancer ([place])    skin cancer   COPD (chronic obstructive pulmonary disease) ([place])    "mild" per pt   GERD (gastroesophageal reflux disease)    Hyperlipidemia    Hypertension    Pneumonia    resolved    Family History: Family History  Problem Relation Age of Onset   Cancer Mother    Heart disease Father     Social History   Socioeconomic History   Marital status: Married    Spouse name: Not on file   Number of children: Not on file   Years of education: Not on file   Highest education level: Not on file  Occupational History   Not on file  Tobacco Use   Smoking  status: Every Day    Packs/day: 1.50    Years: 40.00    Pack years: 60.00    Types: Cigarettes   Smokeless tobacco: Never  Vaping Use   Vaping Use: Never used  Substance and Sexual Activity   Alcohol use: Yes    Comment: occasional   Drug use: Never   Sexual activity: Not on file  Other Topics Concern   Not on file  Social History Narrative   Not on file   Social Determinants of Health   Financial Resource Strain: Not on file  Food Insecurity: Not on file  Transportation Needs: Not on file   Physical Activity: Not on file  Stress: Not on file  Social Connections: Not on file  Intimate Partner Violence: Not on file      Review of Systems  Constitutional:  Positive for chills and fatigue. Negative for activity change, appetite change, fever and unexpected weight change.  HENT:  Positive for postnasal drip and sore throat. Negative for congestion, ear pain, rhinorrhea and trouble swallowing.   Eyes: Negative.   Respiratory:  Positive for cough, chest tightness, shortness of breath and wheezing.   Cardiovascular: Negative.  Negative for chest pain and palpitations.  Gastrointestinal: Negative.  Negative for abdominal pain, blood in stool, constipation, diarrhea, nausea and vomiting.  Endocrine: Negative.   Genitourinary: Negative.  Negative for difficulty urinating, dysuria, frequency, hematuria and urgency.  Musculoskeletal:  Positive for myalgias. Negative for arthralgias, back pain, joint swelling and neck pain.  Skin: Negative.  Negative for rash and wound.  Allergic/Immunologic: Negative.  Negative for immunocompromised state.  Neurological: Negative.  Negative for dizziness, seizures, numbness and headaches.  Hematological: Negative.   Psychiatric/Behavioral: Negative.  Negative for behavioral problems, self-injury and suicidal ideas. The patient is not nervous/anxious.    Vital Signs: BP 118/80 Comment: 144/111, 147/79  Pulse 84   Temp 98.5 F (36.9 C)   Resp 16   Ht 5\' 10"  (1.778 m)   Wt 216 lb 9.6 oz (98.2 kg)   SpO2 97%   BMI 31.08 kg/m    Physical Exam Constitutional:      General: He is awake. He is not in acute distress.    Appearance: He is well-developed and well-groomed. He is not ill-appearing or diaphoretic.  HENT:     Head: Normocephalic and atraumatic.     Right Ear: Tympanic membrane, ear canal and external ear normal.     Left Ear: Tympanic membrane, ear canal and external ear normal.     Nose: Nose normal.     Mouth/Throat:     Lips:  Pink.     Mouth: Mucous membranes are moist.     Pharynx: Uvula midline. Posterior oropharyngeal erythema present. No oropharyngeal exudate.  Eyes:     General: No scleral icterus.       Right eye: No discharge.        Left eye: No discharge.     Extraocular Movements: Extraocular movements intact.     Conjunctiva/sclera: Conjunctivae normal.     Pupils: Pupils are equal, round, and reactive to light.  Neck:     Thyroid: No thyromegaly.     Vascular: No JVD.     Trachea: No tracheal deviation.  Cardiovascular:     Rate and Rhythm: Normal rate and regular rhythm.     Pulses: Normal pulses.     Heart sounds: Normal heart sounds, S1 normal and S2 normal. No murmur heard.   No friction rub. No  gallop.  Pulmonary:     Effort: Pulmonary effort is normal. No accessory muscle usage or respiratory distress.     Breath sounds: Decreased air movement present. No stridor. Wheezing and rales present.  Chest:     Chest wall: No tenderness.  Abdominal:     General: Bowel sounds are normal. There is no distension.     Palpations: Abdomen is soft. There is no mass.     Tenderness: There is no abdominal tenderness. There is no guarding or rebound.  Musculoskeletal:        General: No tenderness or deformity. Normal range of motion.     Cervical back: Normal range of motion and neck supple.  Lymphadenopathy:     Cervical: Cervical adenopathy present.  Skin:    General: Skin is warm and dry.     Capillary Refill: Capillary refill takes less than 2 seconds.     Coloration: Skin is not pale.     Findings: No erythema or rash.  Neurological:     Mental Status: He is alert and oriented to person, place, and time.     Cranial Nerves: No cranial nerve deficit.     Motor: No abnormal muscle tone.     Coordination: Coordination normal.     Deep Tendon Reflexes: Reflexes are normal and symmetric.  Psychiatric:        Mood and Affect: Mood normal.        Behavior: Behavior normal. Behavior is  cooperative.        Thought Content: Thought content normal.        Judgment: Judgment normal.       Assessment/Plan: 1. Encounter for routine adult health examination with abnormal findings Age-appropriate preventive screenings and vaccinations discussed, annual physical exam completed. Routine labs for health maintenance declined by patient. PHM updated.   2. Other emphysema ([place]) Stable, improved, refills ordered.  - Fluticasone-Umeclidin-Vilant (TRELEGY ELLIPTA) 100-62.5-25 MCG/ACT AEPB; Inhale 1 puff into the lungs daily.  Dispense: 60 each; Refill: 11  3. Essential hypertension Stable, well controlled, refills ordered.  - losartan-hydrochlorothiazide (HYZAAR) 50-12.5 MG tablet; Take 1 tablet by mouth daily.  Dispense: 90 tablet; Refill: 3  4. Mixed hyperlipidemia Stable, continue as prescribed.  - atorvastatin (LIPITOR) 20 MG tablet; Take 1 tablet (20 mg total) by mouth daily.  Dispense: 90 tablet; Refill: 3  5. Chronic right shoulder pain Methocarbamol prescribed.  - methocarbamol (ROBAXIN) 500 MG tablet; Take 1 tablet (500 mg total) by mouth 4 (four) times daily.  Dispense: 120 tablet; Refill: 0  6. Dysuria Routine urinalysis done - UA/M w/rflx Culture, Routine - Microscopic Examination  7. COPD with acute exacerbation ([place]) Empiric antibiotic treatment and a prednisone taper prescribed.  - methylPREDNISolone (MEDROL DOSEPAK) 4 MG TBPK tablet; Take as directed  Dispense: 21 tablet; Refill: 0 - levofloxacin (LEVAQUIN) 750 MG tablet; Take 1 tablet (750 mg total) by mouth daily for 7 days.  Dispense: 7 tablet; Refill: 0  8. Need for vaccination - pneumococcal 20-valent conjugate vaccine (PREVNAR 20) 0.5 ML injection; Inject 0.5 mLs into the muscle once for 1 dose.  Dispense: 0.5 mL; Refill: 0  9. Gastroesophageal reflux disease without esophagitis Stable with omeprazole. - omeprazole (PRILOSEC OTC) 20 MG tablet; Take 1 tablet (20 mg total) by mouth daily.  Dispense: 90  tablet; Refill: 3     General Counseling: Feliberto verbalizes understanding of the findings of todays visit and agrees with plan of treatment. I have discussed any further diagnostic evaluation that  may be needed or ordered today. We also reviewed his medications today. he has been encouraged to call the office with any questions or concerns that should arise related to todays visit.    Orders Placed This Encounter  Procedures   Microscopic Examination   UA/M w/rflx Culture, Routine     Meds ordered this encounter  Medications   methocarbamol (ROBAXIN) 500 MG tablet    Sig: Take 1 tablet (500 mg total) by mouth 4 (four) times daily.    Dispense:  120 tablet    Refill:  0   Fluticasone-Umeclidin-Vilant (TRELEGY ELLIPTA) 100-62.5-25 MCG/ACT AEPB    Sig: Inhale 1 puff into the lungs daily.    Dispense:  60 each    Refill:  11    Patient is requesting  3 month supply please   omeprazole (PRILOSEC OTC) 20 MG tablet    Sig: Take 1 tablet (20 mg total) by mouth daily.    Dispense:  90 tablet    Refill:  3   atorvastatin (LIPITOR) 20 MG tablet    Sig: Take 1 tablet (20 mg total) by mouth daily.    Dispense:  90 tablet    Refill:  3   losartan-hydrochlorothiazide (HYZAAR) 50-12.5 MG tablet    Sig: Take 1 tablet by mouth daily.    Dispense:  90 tablet    Refill:  3   pneumococcal 20-valent conjugate vaccine (PREVNAR 20) 0.5 ML injection    Sig: Inject 0.5 mLs into the muscle once for 1 dose.    Dispense:  0.5 mL    Refill:  0   methylPREDNISolone (MEDROL DOSEPAK) 4 MG TBPK tablet    Sig: Take as directed    Dispense:  21 tablet    Refill:  0   levofloxacin (LEVAQUIN) 750 MG tablet    Sig: Take 1 tablet (750 mg total) by mouth daily for 7 days.    Dispense:  7 tablet    Refill:  0    Return in about 13 months (around 01/20/2022) for CPE with Kerith Sherley and lauren.   Total time spent:30 Minutes Time spent includes review of chart, medications, test results, and follow up plan  with the patient.   [place] Controlled Substance Database was reviewed by me.  This patient was seen by Jonetta Osgood, FNP-C in collaboration with Dr. Clayborn Bigness as a part of collaborative care agreement.  Lessly Stigler R. Valetta Fuller, MSN, FNP-C Internal medicine

## 2020-12-23 LAB — UA/M W/RFLX CULTURE, ROUTINE
Bilirubin, UA: NEGATIVE
Glucose, UA: NEGATIVE
Ketones, UA: NEGATIVE
Leukocytes,UA: NEGATIVE
Nitrite, UA: NEGATIVE
Protein,UA: NEGATIVE
RBC, UA: NEGATIVE
Specific Gravity, UA: 1.006 (ref 1.005–1.030)
Urobilinogen, Ur: 0.2 mg/dL (ref 0.2–1.0)
pH, UA: 7 (ref 5.0–7.5)

## 2020-12-23 LAB — MICROSCOPIC EXAMINATION
Bacteria, UA: NONE SEEN
Casts: NONE SEEN /lpf
Epithelial Cells (non renal): NONE SEEN /hpf (ref 0–10)
RBC, Urine: NONE SEEN /hpf (ref 0–2)
WBC, UA: NONE SEEN /hpf (ref 0–5)

## 2021-01-16 ENCOUNTER — Encounter: Payer: Self-pay | Admitting: Nurse Practitioner

## 2021-01-20 IMAGING — CR DG CHEST 2V
1 series · 2 of 2 positions shown · non-contrast
Comparison: 09/06/2018

CLINICAL DATA: Cough, follow-up pneumonia

EXAM:
CHEST - 2 VIEW

[Series 1: dg chest 2 view · 0.14mm/px · 2 of 2 slices shown]
[im 1/2]
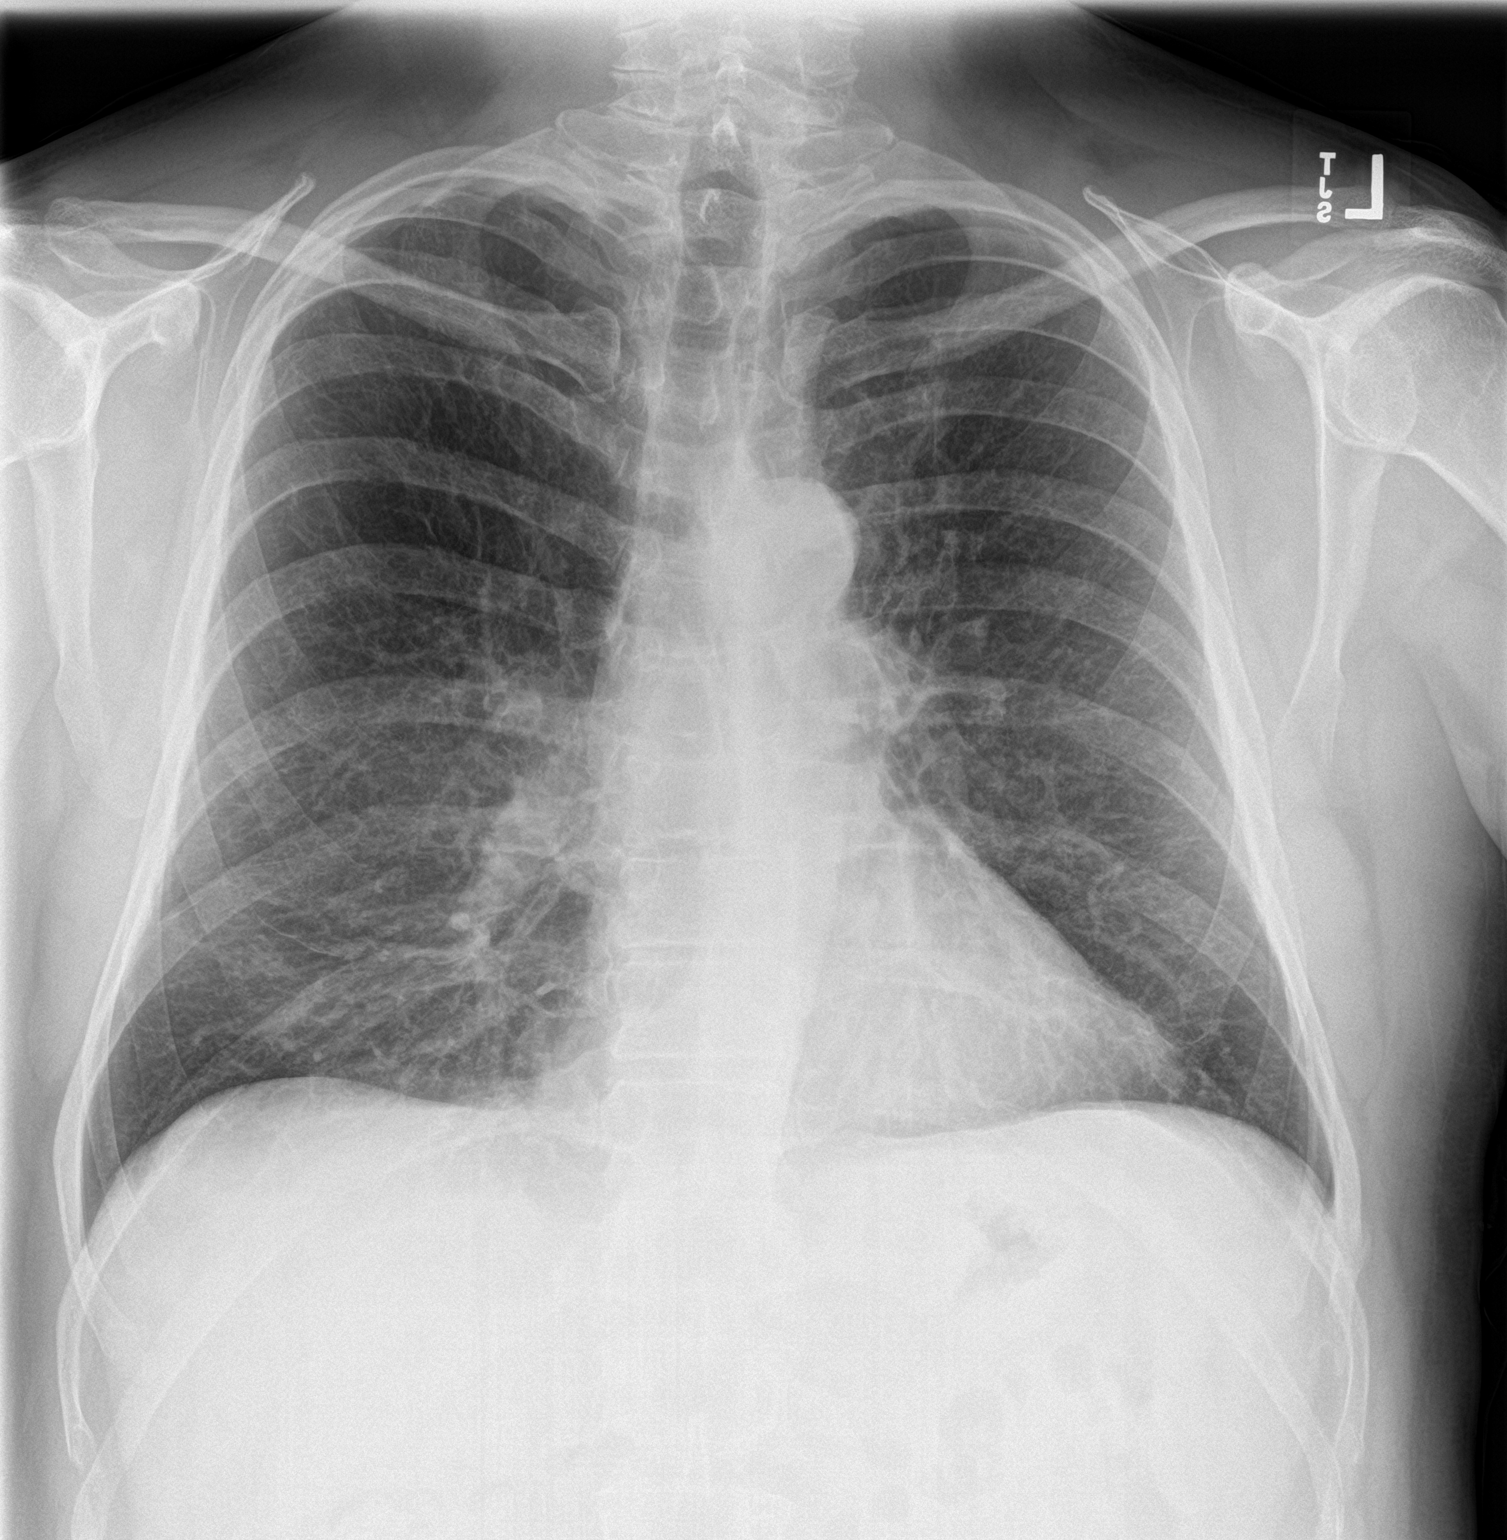
[im 2/2]
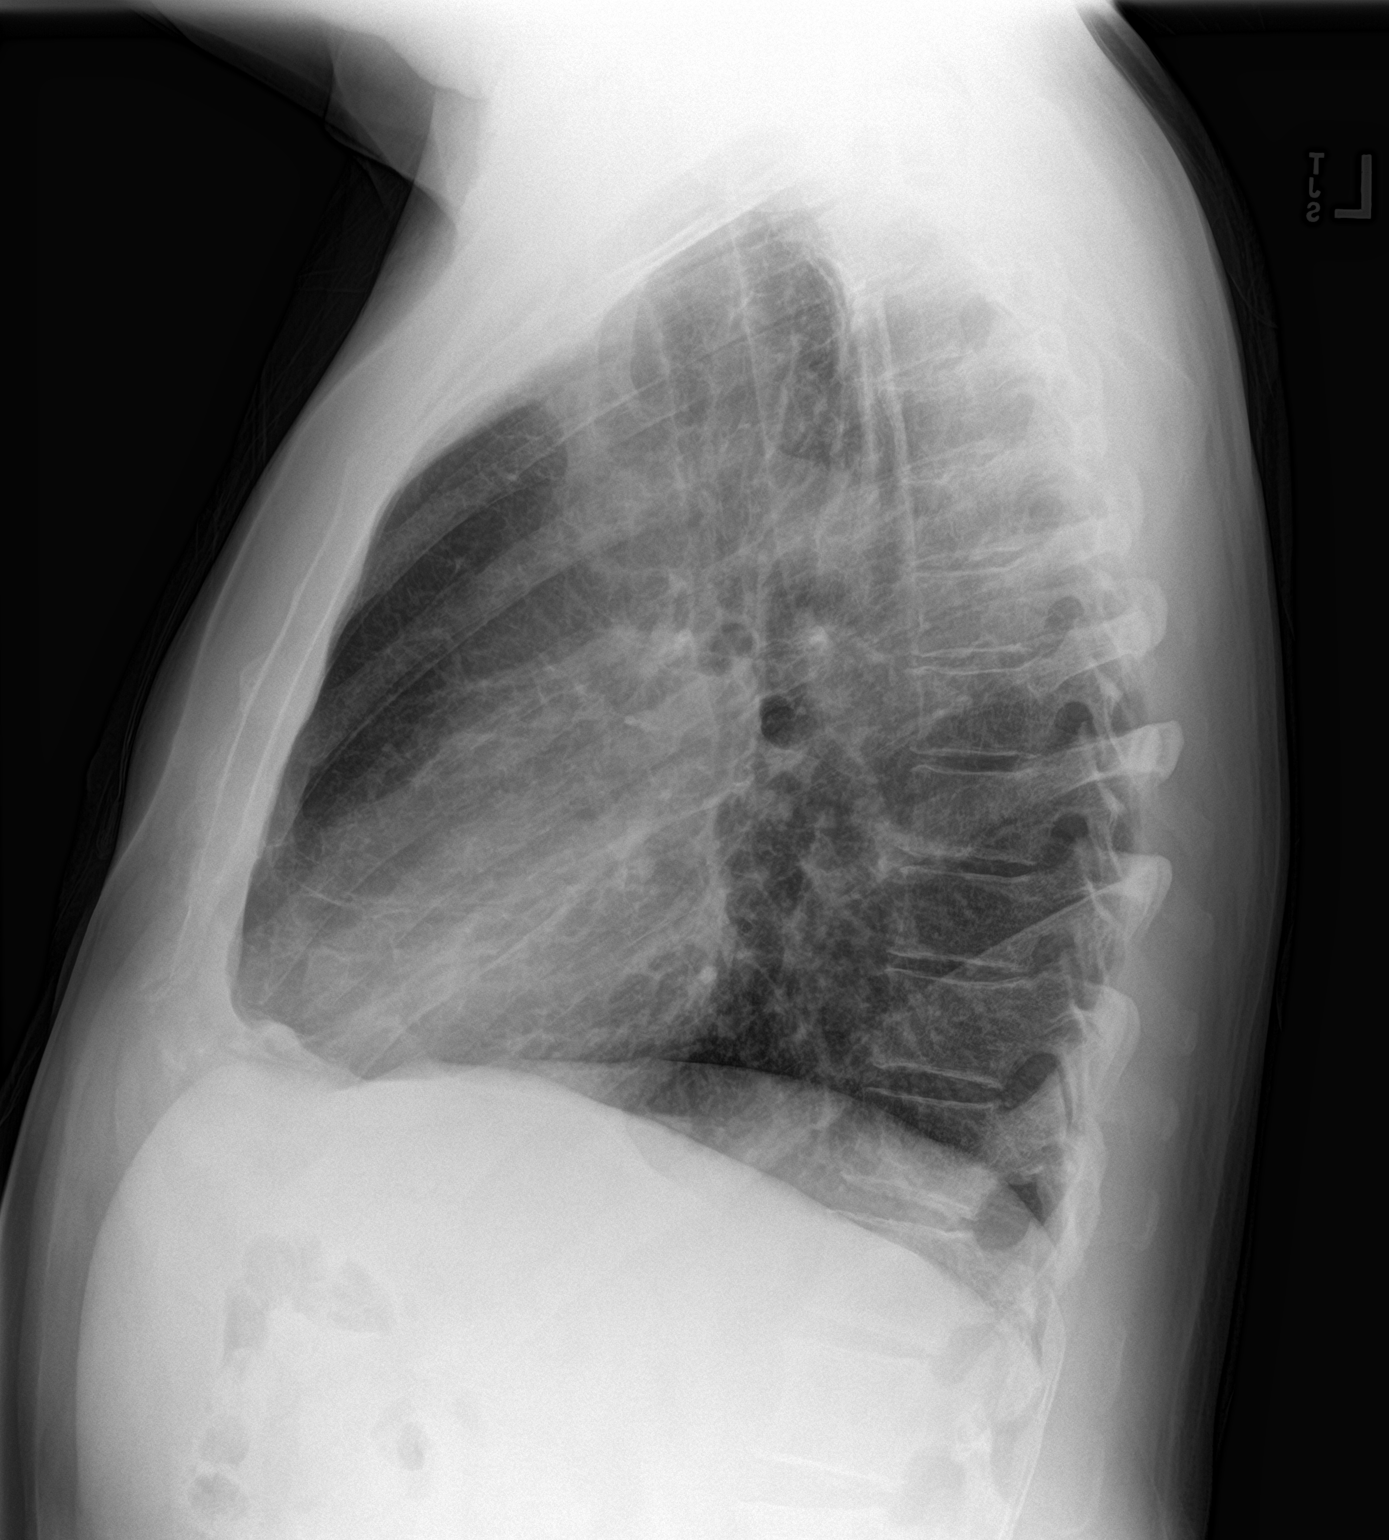

[2 of 2 positions shown; findings below may reference images not displayed]

FINDINGS: Stable cardiomediastinal contours. Near complete resolution the
previously seen medial left upper lobe airspace opacity. No new
airspace consolidation. No pleural effusion. No pneumothorax.
IMPRESSION: Near-complete resolution of medial left upper lobe airspace opacity.

## 2021-02-24 ENCOUNTER — Telehealth: Payer: Self-pay | Admitting: Acute Care

## 2021-02-24 NOTE — Telephone Encounter (Signed)
Left detailed message for pt to call back to schedule. F/U lung screening CT scan.

## 2021-05-20 ENCOUNTER — Telehealth: Payer: Self-pay

## 2021-05-20 NOTE — Telephone Encounter (Signed)
Called and left voicemail advising patient that records are complete and ready for pickup at the front desk. ?

## 2021-12-07 ENCOUNTER — Encounter: Payer: Self-pay | Admitting: Nurse Practitioner

## 2022-01-21 ENCOUNTER — Encounter: Payer: Self-pay | Admitting: Nurse Practitioner

## 2022-02-18 ENCOUNTER — Encounter: Payer: BC Managed Care – PPO | Admitting: Nurse Practitioner

## 2022-03-05 ENCOUNTER — Encounter: Payer: Self-pay | Admitting: Nurse Practitioner

## 2022-03-05 ENCOUNTER — Ambulatory Visit (INDEPENDENT_AMBULATORY_CARE_PROVIDER_SITE_OTHER): Payer: BC Managed Care – PPO | Admitting: Nurse Practitioner

## 2022-03-05 VITALS — BP 125/80 | HR 100 | Temp 98.6°F | Resp 16 | Ht 70.0 in | Wt 223.0 lb

## 2022-03-05 DIAGNOSIS — I10 Essential (primary) hypertension: Secondary | ICD-10-CM

## 2022-03-05 DIAGNOSIS — J438 Other emphysema: Secondary | ICD-10-CM

## 2022-03-05 DIAGNOSIS — R300 Dysuria: Secondary | ICD-10-CM | POA: Diagnosis not present

## 2022-03-05 DIAGNOSIS — Z0001 Encounter for general adult medical examination with abnormal findings: Secondary | ICD-10-CM | POA: Diagnosis not present

## 2022-03-05 DIAGNOSIS — E782 Mixed hyperlipidemia: Secondary | ICD-10-CM | POA: Diagnosis not present

## 2022-03-05 MED ORDER — LOSARTAN POTASSIUM-HCTZ 50-12.5 MG PO TABS: 1.0000 | ORAL_TABLET | Freq: Every day | ORAL | 3 refills | Status: DC

## 2022-03-05 MED ORDER — ATORVASTATIN CALCIUM 20 MG PO TABS: 20.0000 mg | ORAL_TABLET | Freq: Every day | ORAL | 3 refills | Status: DC

## 2022-03-05 MED ORDER — TRELEGY ELLIPTA 100-62.5-25 MCG/ACT IN AEPB: 1.0000 | INHALATION_SPRAY | Freq: Every day | RESPIRATORY_TRACT | 11 refills | Status: DC

## 2022-03-05 NOTE — Progress Notes (Signed)
[facility] [place], [place] [address]  Internal MEDICINE  Office Visit Note  Patient Name: Kenneth Norman  [id number]  [id number]  Date of Service: 03/05/2022  Chief Complaint  Patient presents with   Annual Exam    COPD    HPI Kenneth Norman presents for an annual well visit and physical exam.  Well-appearing 62 y.o. male with hypertension, allergic rhinitis, COPD, and high cholesterol.  Routine CRC screening: deferred until next office visit.  Labs: deferred for now  New or worsening pain: none  Other concerns: none  Due for refills.    Current Medication: Outpatient Encounter Medications as of 03/05/2022  Medication Sig   Fluticasone-Umeclidin-Vilant (TRELEGY ELLIPTA) 100-62.5-25 MCG/ACT AEPB Inhale 1 puff into the lungs daily.   atorvastatin (LIPITOR) 20 MG tablet Take 1 tablet (20 mg total) by mouth daily.   losartan-hydrochlorothiazide (HYZAAR) 50-12.5 MG tablet Take 1 tablet by mouth daily.   omeprazole (PRILOSEC OTC) 20 MG tablet Take 1 tablet (20 mg total) by mouth daily.   [DISCONTINUED] atorvastatin (LIPITOR) 20 MG tablet Take 1 tablet (20 mg total) by mouth daily.   [DISCONTINUED] Fluticasone-Umeclidin-Vilant (TRELEGY ELLIPTA) 100-62.5-25 MCG/ACT AEPB Inhale 1 puff into the lungs daily.   [DISCONTINUED] losartan-hydrochlorothiazide (HYZAAR) 50-12.5 MG tablet Take 1 tablet by mouth daily.   [DISCONTINUED] methocarbamol (ROBAXIN) 500 MG tablet Take 1 tablet (500 mg total) by mouth 4 (four) times daily.   [DISCONTINUED] methylPREDNISolone (MEDROL DOSEPAK) 4 MG TBPK tablet Take as directed   No facility-administered encounter medications on file as of 03/05/2022.    Surgical History: Past Surgical History:  Procedure Laterality Date   NASAL SEPTUM SURGERY     NASAL SINUS SURGERY     XI ROBOTIC ASSISTED INGUINAL HERNIA REPAIR WITH MESH Left 02/14/2019   Procedure: XI ROBOTIC ASSISTED INGUINAL HERNIA REPAIR WITH MESH-possible bilateral;  Surgeon:  [name], MD;  Location: [facility] ORS;  Service: General;  Laterality: Left;    Medical History: Past Medical History:  Diagnosis Date   Cancer ([place])    skin cancer   COPD (chronic obstructive pulmonary disease) ([place])    "mild" per pt   GERD (gastroesophageal reflux disease)    Hyperlipidemia    Hypertension    Pneumonia    resolved    Family History: Family History  Problem Relation Age of Onset   Cancer Mother    Heart disease Father     Social History   Socioeconomic History   Marital status: Married    Spouse name: Not on file   Number of children: Not on file   Years of education: Not on file   Highest education level: Not on file  Occupational History   Not on file  Tobacco Use   Smoking status: Every Day    Packs/day: 1.50    Years: 40.00    Total pack years: 60.00    Types: Cigarettes   Smokeless tobacco: Never  Vaping Use   Vaping Use: Never used  Substance and Sexual Activity   Alcohol use: Yes    Comment: occasional   Drug use: Never   Sexual activity: Not on file  Other Topics Concern   Not on file  Social History Narrative   Not on file   Social Determinants of Health   Financial Resource Strain: Not on file  Food Insecurity: Not on file  Transportation Needs: Not on file  Physical Activity: Not on file  Stress: Not on file  Social Connections: Not on  file  Intimate Partner Violence: Not on file      Review of Systems  Constitutional:  Positive for chills and fatigue. Negative for activity change, appetite change, fever and unexpected weight change.  HENT:  Positive for postnasal drip and sore throat. Negative for congestion, ear pain, rhinorrhea and trouble swallowing.   Eyes: Negative.   Respiratory:  Positive for cough, chest tightness, shortness of breath and wheezing.        Symptoms are intermittent with his COPD  Cardiovascular: Negative.  Negative for chest pain and palpitations.  Gastrointestinal: Negative.  Negative  for abdominal pain, blood in stool, constipation, diarrhea, nausea and vomiting.  Endocrine: Negative.   Genitourinary: Negative.  Negative for difficulty urinating, dysuria, frequency, hematuria and urgency.  Musculoskeletal:  Positive for myalgias. Negative for arthralgias, back pain, joint swelling and neck pain.  Skin: Negative.  Negative for rash and wound.  Allergic/Immunologic: Negative.  Negative for immunocompromised state.  Neurological: Negative.  Negative for dizziness, seizures, numbness and headaches.  Hematological: Negative.   Psychiatric/Behavioral: Negative.  Negative for behavioral problems, self-injury and suicidal ideas. The patient is not nervous/anxious.     Vital Signs: BP 125/80 Comment: 143/91  Pulse 100   Temp 98.6 F (37 C)   Resp 16   Ht 5' 10"$  (1.778 m)   Wt 223 lb (101.2 kg)   SpO2 94%   BMI 32.00 kg/m    Physical Exam Vitals reviewed.  Constitutional:      General: He is awake. He is not in acute distress.    Appearance: He is well-developed and well-groomed. He is not ill-appearing or diaphoretic.  HENT:     Head: Normocephalic and atraumatic.     Right Ear: Tympanic membrane, ear canal and external ear normal.     Left Ear: Tympanic membrane, ear canal and external ear normal.     Nose: Nose normal.     Mouth/Throat:     Lips: Pink.     Mouth: Mucous membranes are moist.     Pharynx: Oropharynx is clear. Uvula midline. No oropharyngeal exudate or posterior oropharyngeal erythema.  Eyes:     General: No scleral icterus.       Right eye: No discharge.        Left eye: No discharge.     Extraocular Movements: Extraocular movements intact.     Conjunctiva/sclera: Conjunctivae normal.     Pupils: Pupils are equal, round, and reactive to light.  Neck:     Thyroid: No thyromegaly.     Vascular: No JVD.     Trachea: No tracheal deviation.  Cardiovascular:     Rate and Rhythm: Normal rate and regular rhythm.     Pulses: Normal pulses.      Heart sounds: Normal heart sounds, S1 normal and S2 normal. No murmur heard.    No friction rub. No gallop.  Pulmonary:     Effort: Pulmonary effort is normal. No accessory muscle usage or respiratory distress.     Breath sounds: Normal breath sounds. Decreased air movement present. No stridor. No wheezing or rales.  Chest:     Chest wall: No tenderness.  Abdominal:     General: Bowel sounds are normal. There is no distension.     Palpations: Abdomen is soft. There is no mass.     Tenderness: There is no abdominal tenderness. There is no guarding or rebound.  Musculoskeletal:        General: No tenderness or deformity. Normal range of  motion.     Cervical back: Normal range of motion and neck supple.  Lymphadenopathy:     Cervical: No cervical adenopathy.  Skin:    General: Skin is warm and dry.     Capillary Refill: Capillary refill takes less than 2 seconds.     Coloration: Skin is not pale.     Findings: No erythema or rash.  Neurological:     Mental Status: He is alert and oriented to person, place, and time.     Cranial Nerves: No cranial nerve deficit.     Motor: No abnormal muscle tone.     Coordination: Coordination normal.     Gait: Gait normal.     Deep Tendon Reflexes: Reflexes are normal and symmetric.  Psychiatric:        Mood and Affect: Mood normal.        Behavior: Behavior normal. Behavior is cooperative.        Thought Content: Thought content normal.        Judgment: Judgment normal.        Assessment/Plan: 1. Encounter for routine adult health examination with abnormal findings Age-appropriate preventive screenings and vaccinations discussed, annual physical exam completed. Routine labs for health maintenance ordered, deferred for now per patient. PHM updated.   2. Other emphysema ([place]) Continue trelegy ellipta as prescribed.  - Fluticasone-Umeclidin-Vilant (TRELEGY ELLIPTA) 100-62.5-25 MCG/ACT AEPB; Inhale 1 puff into the lungs daily.  Dispense: 60  each; Refill: 11  3. Essential hypertension Continue losartan-HCTZ as prescribed.  - losartan-hydrochlorothiazide (HYZAAR) 50-12.5 MG tablet; Take 1 tablet by mouth daily.  Dispense: 90 tablet; Refill: 3  4. Mixed hyperlipidemia Continue atorvastatin as prescribed.  - atorvastatin (LIPITOR) 20 MG tablet; Take 1 tablet (20 mg total) by mouth daily.  Dispense: 90 tablet; Refill: 3  5. Dysuria Routine urinalysis done - UA/M w/rflx Culture, Routine - Microscopic Examination      General Counseling: Shi verbalizes understanding of the findings of todays visit and agrees with plan of treatment. I have discussed any further diagnostic evaluation that may be needed or ordered today. We also reviewed his medications today. he has been encouraged to call the office with any questions or concerns that should arise related to todays visit.    Orders Placed This Encounter  Procedures   UA/M w/rflx Culture, Routine    Meds ordered this encounter  Medications   Fluticasone-Umeclidin-Vilant (TRELEGY ELLIPTA) 100-62.5-25 MCG/ACT AEPB    Sig: Inhale 1 puff into the lungs daily.    Dispense:  60 each    Refill:  11   atorvastatin (LIPITOR) 20 MG tablet    Sig: Take 1 tablet (20 mg total) by mouth daily.    Dispense:  90 tablet    Refill:  3   losartan-hydrochlorothiazide (HYZAAR) 50-12.5 MG tablet    Sig: Take 1 tablet by mouth daily.    Dispense:  90 tablet    Refill:  3    Return for F/U, Donnalyn Juran PCP need apt in early september on a friday.   Total time spent:30 Minutes Time spent includes review of chart, medications, test results, and follow up plan with the patient.   [place] Controlled Substance Database was reviewed by me.  This patient was seen by Jonetta Osgood, FNP-C in collaboration with Dr. Clayborn Bigness as a part of collaborative care agreement.  Shondra Capps R. Valetta Fuller, MSN, FNP-C Internal medicine

## 2022-03-06 LAB — UA/M W/RFLX CULTURE, ROUTINE
Bilirubin, UA: NEGATIVE
Glucose, UA: NEGATIVE
Ketones, UA: NEGATIVE
Leukocytes,UA: NEGATIVE
Nitrite, UA: NEGATIVE
Protein,UA: NEGATIVE
RBC, UA: NEGATIVE
Specific Gravity, UA: 1.006 (ref 1.005–1.030)
Urobilinogen, Ur: 0.2 mg/dL (ref 0.2–1.0)
pH, UA: 7 (ref 5.0–7.5)

## 2022-03-06 LAB — MICROSCOPIC EXAMINATION
Bacteria, UA: NONE SEEN
Casts: NONE SEEN /lpf
Epithelial Cells (non renal): NONE SEEN /hpf (ref 0–10)
RBC, Urine: NONE SEEN /hpf (ref 0–2)
WBC, UA: NONE SEEN /hpf (ref 0–5)

## 2022-03-14 ENCOUNTER — Encounter: Payer: Self-pay | Admitting: Nurse Practitioner

## 2022-10-01 ENCOUNTER — Ambulatory Visit (INDEPENDENT_AMBULATORY_CARE_PROVIDER_SITE_OTHER): Payer: BC Managed Care – PPO | Admitting: Nurse Practitioner

## 2022-10-01 ENCOUNTER — Encounter: Payer: Self-pay | Admitting: Nurse Practitioner

## 2022-10-01 VITALS — BP 135/85 | HR 71 | Temp 96.9°F | Resp 16 | Ht 70.0 in | Wt 218.8 lb

## 2022-10-01 DIAGNOSIS — I10 Essential (primary) hypertension: Secondary | ICD-10-CM | POA: Diagnosis not present

## 2022-10-01 DIAGNOSIS — K219 Gastro-esophageal reflux disease without esophagitis: Secondary | ICD-10-CM

## 2022-10-01 DIAGNOSIS — Z125 Encounter for screening for malignant neoplasm of prostate: Secondary | ICD-10-CM | POA: Diagnosis not present

## 2022-10-01 DIAGNOSIS — E782 Mixed hyperlipidemia: Secondary | ICD-10-CM

## 2022-10-01 DIAGNOSIS — J438 Other emphysema: Secondary | ICD-10-CM | POA: Diagnosis not present

## 2022-10-01 NOTE — Progress Notes (Signed)
[facility] [address] [place], [place] [address]  Internal MEDICINE  Office Visit Note  Patient Name: Kenneth Norman  [id number]  [id number]  Date of Service: 10/01/2022  Chief Complaint  Patient presents with   Gastroesophageal Reflux   Hyperlipidemia   Hypertension   Follow-up    HPI Maxemiliano presents for a follow-up visit for hypertension, COPD, smoking and labs  Hypertension -- elevated BP today, improved when rechecked.  COPD -- using trelegy, helping, breathing stable, coughing decreased.  Due for routine labs  Working on smoking cessation, using nicotine pouches which is helping, dropped from 2 ppd to 1.5 ppd and still going down.    Current Medication: Outpatient Encounter Medications as of 10/01/2022  Medication Sig   atorvastatin (LIPITOR) 20 MG tablet Take 1 tablet (20 mg total) by mouth daily.   Fluticasone-Umeclidin-Vilant (TRELEGY ELLIPTA) 100-62.5-25 MCG/ACT AEPB Inhale 1 puff into the lungs daily.   losartan-hydrochlorothiazide (HYZAAR) 50-12.5 MG tablet Take 1 tablet by mouth daily.   omeprazole (PRILOSEC OTC) 20 MG tablet Take 1 tablet (20 mg total) by mouth daily.   No facility-administered encounter medications on file as of 10/01/2022.    Surgical History: Past Surgical History:  Procedure Laterality Date   NASAL SEPTUM SURGERY     NASAL SINUS SURGERY     XI ROBOTIC ASSISTED INGUINAL HERNIA REPAIR WITH MESH Left 02/14/2019   Procedure: XI ROBOTIC ASSISTED INGUINAL HERNIA REPAIR WITH MESH-possible bilateral;  Surgeon: Campbell Lerner, MD;  Location: [facility] ORS;  Service: General;  Laterality: Left;    Medical History: Past Medical History:  Diagnosis Date   Cancer (HCC)    skin cancer   COPD (chronic obstructive pulmonary disease) (HCC)    "mild" per pt   GERD (gastroesophageal reflux disease)    Hyperlipidemia    Hypertension    Pneumonia    resolved    Family History: Family History  Problem Relation Age of Onset   Cancer Mother     Heart disease Father     Social History   Socioeconomic History   Marital status: Married    Spouse name: Not on file   Number of children: Not on file   Years of education: Not on file   Highest education level: Not on file  Occupational History   Not on file  Tobacco Use   Smoking status: Every Day    Current packs/day: 1.50    Average packs/day: 1.5 packs/day for 40.0 years (60.0 ttl pk-yrs)    Types: Cigarettes   Smokeless tobacco: Never  Vaping Use   Vaping status: Never Used  Substance and Sexual Activity   Alcohol use: Yes    Comment: occasional   Drug use: Never   Sexual activity: Not on file  Other Topics Concern   Not on file  Social History Narrative   Not on file   Social Determinants of Health   Financial Resource Strain: Not on file  Food Insecurity: Not on file  Transportation Needs: Not on file  Physical Activity: Not on file  Stress: Not on file  Social Connections: Not on file  Intimate Partner Violence: Not on file      Review of Systems  Constitutional:  Negative for chills, fatigue and unexpected weight change.  HENT:  Negative for congestion, postnasal drip, rhinorrhea, sneezing and sore throat.   Eyes:  Negative for redness.  Respiratory: Negative.  Negative for cough, chest tightness, shortness of breath and wheezing.   Cardiovascular: Negative.  Negative for chest pain and palpitations.  Gastrointestinal:  Negative for abdominal pain, constipation, diarrhea, nausea and vomiting.  Genitourinary:  Negative for dysuria and frequency.  Musculoskeletal:  Negative for arthralgias, back pain, joint swelling and neck pain.  Skin:  Negative for rash.  Neurological: Negative.  Negative for tremors and numbness.  Hematological:  Negative for adenopathy. Does not bruise/bleed easily.  Psychiatric/Behavioral:  Negative for behavioral problems (Depression), sleep disturbance and suicidal ideas. The patient is not nervous/anxious.     Vital  Signs: BP 135/85 Comment: 130/96  Pulse 71   Temp (!) 96.9 F (36.1 C)   Resp 16   Ht 5\' 10"  (1.778 m)   Wt 218 lb 12.8 oz (99.2 kg)   SpO2 93%   BMI 31.39 kg/m    Physical Exam Vitals reviewed.  Constitutional:      General: He is not in acute distress.    Appearance: Normal appearance. He is not ill-appearing.  HENT:     Head: Normocephalic and atraumatic.  Eyes:     Pupils: Pupils are equal, round, and reactive to light.  Cardiovascular:     Rate and Rhythm: Normal rate and regular rhythm.  Pulmonary:     Effort: Pulmonary effort is normal. No respiratory distress.  Neurological:     Mental Status: He is alert and oriented to person, place, and time.  Psychiatric:        Mood and Affect: Mood normal.        Behavior: Behavior normal.        Assessment/Plan: 1. Essential hypertension Stable, continue medication as prescribed. Routine labs ordered - CBC with Differential/Platelet - CMP14+EGFR - Lipid Profile - PSA Total (Reflex To Free) - Hgb A1C w/o eAG  2. Other emphysema (HCC) Routine labs ordered  - CBC with Differential/Platelet - CMP14+EGFR - Lipid Profile - PSA Total (Reflex To Free) - Hgb A1C w/o eAG  3. Mixed hyperlipidemia Routine labs ordered - CBC with Differential/Platelet - CMP14+EGFR - Lipid Profile - PSA Total (Reflex To Free) - Hgb A1C w/o eAG  4. Gastroesophageal reflux disease without esophagitis Routine labs ordered  - CBC with Differential/Platelet - CMP14+EGFR - Lipid Profile - PSA Total (Reflex To Free) - Hgb A1C w/o eAG  5. Screening for prostate cancer Routine PSA lab ordered  - PSA Total (Reflex To Free)   General Counseling: Tabitha verbalizes understanding of the findings of todays visit and agrees with plan of treatment. I have discussed any further diagnostic evaluation that may be needed or ordered today. We also reviewed his medications today. he has been encouraged to call the office with any questions or  concerns that should arise related to todays visit.    Orders Placed This Encounter  Procedures   CBC with Differential/Platelet   CMP14+EGFR   Lipid Profile   PSA Total (Reflex To Free)   Hgb A1C w/o eAG    No orders of the defined types were placed in this encounter.   Return for previously scheduled, CPE, Stewart Pimenta PCP, need to reschedule physical at checkout. .   Total time spent:30 Minutes Time spent includes review of chart, medications, test results, and follow up plan with the patient.   [place] Controlled Substance Database was reviewed by me.  This patient was seen by Sallyanne Kuster, FNP-C in collaboration with Dr. Beverely Risen as a part of collaborative care agreement.   Lynniah Janoski R. Tedd Sias, MSN, FNP-C Internal medicine

## 2022-10-02 LAB — CMP14+EGFR
ALT: 33 IU/L (ref 0–44)
AST: 19 IU/L (ref 0–40)
Albumin: 4.3 g/dL (ref 3.8–4.9)
Alkaline Phosphatase: 146 IU/L — ABNORMAL HIGH (ref 44–121)
BUN/Creatinine Ratio: 14 (ref 10–24)
BUN: 14 mg/dL (ref 8–27)
Bilirubin Total: 0.4 mg/dL (ref 0.0–1.2)
CO2: 23 mmol/L (ref 20–29)
Calcium: 9.8 mg/dL (ref 8.6–10.2)
Chloride: 99 mmol/L (ref 96–106)
Creatinine, Ser: 1.01 mg/dL (ref 0.76–1.27)
Globulin, Total: 2.8 g/dL (ref 1.5–4.5)
Glucose: 194 mg/dL — ABNORMAL HIGH (ref 70–99)
Potassium: 4.1 mmol/L (ref 3.5–5.2)
Sodium: 137 mmol/L (ref 134–144)
Total Protein: 7.1 g/dL (ref 6.0–8.5)
eGFR: 85 mL/min/{1.73_m2} (ref >59)

## 2022-10-02 LAB — LIPID PANEL
Chol/HDL Ratio: 3.9 ratio (ref 0.0–5.0)
Cholesterol, Total: 149 mg/dL (ref 100–199)
HDL: 38 mg/dL — ABNORMAL LOW (ref >39)
LDL Chol Calc (NIH): 77 mg/dL (ref 0–99)
Triglycerides: 204 mg/dL — ABNORMAL HIGH (ref 0–149)
VLDL Cholesterol Cal: 34 mg/dL (ref 5–40)

## 2022-10-02 LAB — PSA TOTAL (REFLEX TO FREE): Prostate Specific Ag, Serum: 0.3 ng/mL (ref 0.0–4.0)

## 2022-10-02 LAB — CBC WITH DIFFERENTIAL/PLATELET
Basophils Absolute: 0.1 10*3/uL (ref 0.0–0.2)
Basos: 1 %
EOS (ABSOLUTE): 0.1 10*3/uL (ref 0.0–0.4)
Eos: 1 %
Hematocrit: 48.8 % (ref 37.5–51.0)
Hemoglobin: 16.7 g/dL (ref 13.0–17.7)
Immature Grans (Abs): 0.1 10*3/uL (ref 0.0–0.1)
Immature Granulocytes: 1 %
Lymphocytes Absolute: 3 10*3/uL (ref 0.7–3.1)
Lymphs: 33 %
MCH: 30.1 pg (ref 26.6–33.0)
MCHC: 34.2 g/dL (ref 31.5–35.7)
MCV: 88 fL (ref 79–97)
Monocytes Absolute: 0.7 10*3/uL (ref 0.1–0.9)
Monocytes: 8 %
Neutrophils Absolute: 5.1 10*3/uL (ref 1.4–7.0)
Neutrophils: 56 %
Platelets: 244 10*3/uL (ref 150–450)
RBC: 5.55 x10E6/uL (ref 4.14–5.80)
RDW: 13.1 % (ref 11.6–15.4)
WBC: 9.1 10*3/uL (ref 3.4–10.8)

## 2022-10-02 LAB — HGB A1C W/O EAG: Hgb A1c MFr Bld: 8.5 % — ABNORMAL HIGH (ref 4.8–5.6)

## 2022-10-18 ENCOUNTER — Encounter: Payer: Self-pay | Admitting: Nurse Practitioner

## 2022-10-18 DIAGNOSIS — E1165 Type 2 diabetes mellitus with hyperglycemia: Secondary | ICD-10-CM

## 2022-11-01 MED ORDER — TIRZEPATIDE 2.5 MG/0.5ML ~~LOC~~ SOAJ: 2.5000 mg | SUBCUTANEOUS | 0 refills | Status: AC

## 2022-11-01 MED ORDER — TIRZEPATIDE 5 MG/0.5ML ~~LOC~~ SOAJ: 5.0000 mg | SUBCUTANEOUS | 1 refills | Status: DC

## 2022-11-01 MED ORDER — METFORMIN HCL ER 500 MG PO TB24: 500.0000 mg | ORAL_TABLET | Freq: Every day | ORAL | 5 refills | Status: DC

## 2022-11-02 ENCOUNTER — Telehealth: Payer: Self-pay

## 2022-11-02 NOTE — Telephone Encounter (Signed)
-----   Message from [facility] sent at 11/01/2022  4:29 AM EDT ----- I have already discussed the A1c and sugars with patient. The rest of his labs were pretty good except for his triglycerides were too high and HDL was low. Getting control of his sugars and weight loss will help with this so we will not add anything else for now

## 2022-11-02 NOTE — Telephone Encounter (Signed)
Patient notified

## 2022-12-02 ENCOUNTER — Telehealth: Payer: Self-pay | Admitting: Nurse Practitioner

## 2022-12-02 NOTE — Telephone Encounter (Signed)
Patient called requesting all medical records, but did not want office notes that mentioned his smoking. After reviewing all office notes, I notified patient that all his notes mention his smoking. Patient stated he would not need then-Toni

## 2023-02-12 ENCOUNTER — Other Ambulatory Visit: Payer: Self-pay | Admitting: Nurse Practitioner

## 2023-02-12 DIAGNOSIS — J438 Other emphysema: Secondary | ICD-10-CM

## 2023-02-22 ENCOUNTER — Other Ambulatory Visit: Payer: Self-pay | Admitting: Nurse Practitioner

## 2023-02-22 DIAGNOSIS — E782 Mixed hyperlipidemia: Secondary | ICD-10-CM

## 2023-02-22 DIAGNOSIS — I10 Essential (primary) hypertension: Secondary | ICD-10-CM

## 2023-03-08 ENCOUNTER — Encounter: Payer: BC Managed Care – PPO | Admitting: Nurse Practitioner

## 2023-03-12 ENCOUNTER — Other Ambulatory Visit: Payer: Self-pay | Admitting: Nurse Practitioner

## 2023-03-12 DIAGNOSIS — J438 Other emphysema: Secondary | ICD-10-CM

## 2023-03-16 ENCOUNTER — Encounter: Payer: Self-pay | Admitting: Nurse Practitioner

## 2023-04-09 ENCOUNTER — Other Ambulatory Visit: Payer: Self-pay | Admitting: Nurse Practitioner

## 2023-04-12 ENCOUNTER — Other Ambulatory Visit: Payer: Self-pay | Admitting: Nurse Practitioner

## 2023-04-12 DIAGNOSIS — J438 Other emphysema: Secondary | ICD-10-CM

## 2023-05-20 ENCOUNTER — Other Ambulatory Visit: Payer: Self-pay | Admitting: Nurse Practitioner

## 2023-05-20 DIAGNOSIS — I10 Essential (primary) hypertension: Secondary | ICD-10-CM

## 2023-05-21 ENCOUNTER — Other Ambulatory Visit: Payer: Self-pay | Admitting: Nurse Practitioner

## 2023-05-21 DIAGNOSIS — E782 Mixed hyperlipidemia: Secondary | ICD-10-CM

## 2023-05-23 NOTE — Telephone Encounter (Signed)
 Last appt 9/24 and next 6/25

## 2023-06-08 ENCOUNTER — Other Ambulatory Visit: Payer: Self-pay | Admitting: Nurse Practitioner

## 2023-06-08 DIAGNOSIS — J438 Other emphysema: Secondary | ICD-10-CM

## 2023-06-17 ENCOUNTER — Encounter: Payer: BC Managed Care – PPO | Admitting: Nurse Practitioner

## 2023-06-30 ENCOUNTER — Encounter: Payer: Self-pay | Admitting: Nurse Practitioner

## 2023-06-30 ENCOUNTER — Ambulatory Visit (INDEPENDENT_AMBULATORY_CARE_PROVIDER_SITE_OTHER): Admitting: Nurse Practitioner

## 2023-06-30 VITALS — BP 120/84 | HR 87 | Temp 98.0°F | Resp 16 | Ht 70.0 in | Wt 174.6 lb

## 2023-06-30 DIAGNOSIS — Z1211 Encounter for screening for malignant neoplasm of colon: Secondary | ICD-10-CM

## 2023-06-30 DIAGNOSIS — E782 Mixed hyperlipidemia: Secondary | ICD-10-CM | POA: Diagnosis not present

## 2023-06-30 DIAGNOSIS — Z0001 Encounter for general adult medical examination with abnormal findings: Secondary | ICD-10-CM | POA: Diagnosis not present

## 2023-06-30 DIAGNOSIS — G8929 Other chronic pain: Secondary | ICD-10-CM | POA: Insufficient documentation

## 2023-06-30 DIAGNOSIS — M5442 Lumbago with sciatica, left side: Secondary | ICD-10-CM

## 2023-06-30 DIAGNOSIS — E1165 Type 2 diabetes mellitus with hyperglycemia: Secondary | ICD-10-CM | POA: Insufficient documentation

## 2023-06-30 DIAGNOSIS — J438 Other emphysema: Secondary | ICD-10-CM

## 2023-06-30 DIAGNOSIS — I10 Essential (primary) hypertension: Secondary | ICD-10-CM

## 2023-06-30 DIAGNOSIS — Z1212 Encounter for screening for malignant neoplasm of rectum: Secondary | ICD-10-CM

## 2023-06-30 MED ORDER — TIRZEPATIDE 5 MG/0.5ML ~~LOC~~ SOAJ
5.0000 mg | SUBCUTANEOUS | 1 refills | Status: DC
Start: 2023-06-30 — End: 2023-12-05

## 2023-06-30 MED ORDER — TRELEGY ELLIPTA 100-62.5-25 MCG/ACT IN AEPB: INHALATION_SPRAY | RESPIRATORY_TRACT | 11 refills | Status: AC

## 2023-06-30 MED ORDER — ATORVASTATIN CALCIUM 20 MG PO TABS: 20.0000 mg | ORAL_TABLET | Freq: Every day | ORAL | 3 refills | Status: AC

## 2023-06-30 MED ORDER — MELOXICAM 15 MG PO TABS: 15.0000 mg | ORAL_TABLET | Freq: Every day | ORAL | 3 refills | Status: DC

## 2023-06-30 NOTE — Progress Notes (Signed)
 [facility] [address] [place], [place] [address]  Internal MEDICINE  Office Visit Note  Patient Name: Kenneth Norman  [id number]  [id number]  Date of Service: 06/30/2023  Chief Complaint  Patient presents with   Gastroesophageal Reflux   Hypertension   Hyperlipidemia   Annual Exam    HPI Elba presents for an annual well visit and physical exam.  Well-appearing 63 y.o. male with diabetes, hypertension, high cholesterol, low back pain Routine CRC screening: due for cologuard  Eye exam: overdue  foot exam: done  Labs: due for routine labs  New or worsening pain:none  Other concerns: Back pain -- had a prior herniated disc in the lumbar spine, having sciatica again.  Was 218 lbs in September last year and has dropped down to 174 lbs while on mounjaro   Current Medication: Outpatient Encounter Medications as of 06/30/2023  Medication Sig   meloxicam  (MOBIC ) 15 MG tablet Take 1 tablet (15 mg total) by mouth daily. In am with breakfast   metFORMIN  (GLUCOPHAGE -XR) 500 MG 24 hr tablet Take 1 tablet by mouth once daily with breakfast   omeprazole  (PRILOSEC  OTC) 20 MG tablet Take 1 tablet (20 mg total) by mouth daily.   [DISCONTINUED] atorvastatin  (LIPITOR) 20 MG tablet Take 1 tablet by mouth once daily   [DISCONTINUED] losartan -hydrochlorothiazide (HYZAAR) 50-12.5 MG tablet TAKE 1 TABLET BY MOUTH ONCE DAILY . APPOINTMENT REQUIRED FOR FUTURE REFILLS   [DISCONTINUED] tirzepatide  (MOUNJARO ) 5 MG/0.5ML Pen Inject 5 mg into the skin once a week.   [DISCONTINUED] TRELEGY ELLIPTA  100-62.5-25 MCG/ACT AEPB INHALE 1 PUFF INTO LUNGS ONCE DAILY   atorvastatin  (LIPITOR) 20 MG tablet Take 1 tablet (20 mg total) by mouth daily.   Fluticasone-Umeclidin-Vilant (TRELEGY ELLIPTA ) 100-62.5-25 MCG/ACT AEPB INHALE 1 PUFF INTO LUNGS ONCE DAILY   tirzepatide  (MOUNJARO ) 5 MG/0.5ML Pen Inject 5 mg into the skin once a week.   No facility-administered encounter medications on file as of 06/30/2023.     Surgical History: Past Surgical History:  Procedure Laterality Date   NASAL SEPTUM SURGERY     NASAL SINUS SURGERY     XI ROBOTIC ASSISTED INGUINAL HERNIA REPAIR WITH MESH Left 02/14/2019   Procedure: XI ROBOTIC ASSISTED INGUINAL HERNIA REPAIR WITH MESH-possible bilateral;  Surgeon: Flynn Hylan, MD;  Location: [facility] ORS;  Service: General;  Laterality: Left;    Medical History: Past Medical History:  Diagnosis Date   Cancer (HCC)    skin cancer   COPD (chronic obstructive pulmonary disease) (HCC)    "mild" per pt   GERD (gastroesophageal reflux disease)    Hyperlipidemia    Hypertension    Pneumonia    resolved    Family History: Family History  Problem Relation Age of Onset   Cancer Mother    Heart disease Father     Social History   Socioeconomic History   Marital status: Married    Spouse name: Not on file   Number of children: Not on file   Years of education: Not on file   Highest education level: Not on file  Occupational History   Not on file  Tobacco Use   Smoking status: Every Day    Current packs/day: 1.50    Average packs/day: 1.5 packs/day for 40.0 years (60.0 ttl pk-yrs)    Types: Cigarettes   Smokeless tobacco: Never  Vaping Use   Vaping status: Never Used  Substance and Sexual Activity   Alcohol use: Not Currently    Comment: occasional   Drug  use: Never   Sexual activity: Not on file  Other Topics Concern   Not on file  Social History Narrative   Not on file   Social Drivers of Health   Financial Resource Strain: Not on file  Food Insecurity: Not on file  Transportation Needs: Not on file  Physical Activity: Not on file  Stress: Not on file  Social Connections: Not on file  Intimate Partner Violence: Not on file      Review of Systems  Constitutional:  Positive for chills and fatigue. Negative for activity change, appetite change, fever and unexpected weight change.  HENT:  Positive for postnasal drip and sore throat.  Negative for congestion, ear pain, rhinorrhea and trouble swallowing.   Eyes: Negative.   Respiratory: Negative.  Negative for cough, chest tightness, shortness of breath and wheezing.        Symptoms are intermittent with his COPD  Cardiovascular: Negative.  Negative for chest pain and palpitations.  Gastrointestinal: Negative.  Negative for abdominal pain, blood in stool, constipation, diarrhea, nausea and vomiting.  Endocrine: Negative.   Genitourinary: Negative.  Negative for difficulty urinating, dysuria, frequency, hematuria and urgency.  Musculoskeletal:  Positive for arthralgias, back pain and myalgias. Negative for joint swelling and neck pain.  Skin: Negative.  Negative for rash and wound.  Allergic/Immunologic: Negative.  Negative for immunocompromised state.  Neurological: Negative.  Negative for dizziness, seizures, numbness and headaches.  Hematological: Negative.   Psychiatric/Behavioral: Negative.  Negative for behavioral problems, self-injury and suicidal ideas. The patient is not nervous/anxious.     Vital Signs: BP 120/84   Pulse 87   Temp 98 F (36.7 C)   Resp 16   Ht 5\' 10"  (1.778 m)   Wt 174 lb 9.6 oz (79.2 kg)   SpO2 99%   BMI 25.05 kg/m    Physical Exam Vitals reviewed.  Constitutional:      General: He is awake. He is not in acute distress.    Appearance: Normal appearance. He is well-developed and well-groomed. He is not ill-appearing or diaphoretic.  HENT:     Head: Normocephalic and atraumatic.     Right Ear: Tympanic membrane, ear canal and external ear normal. There is no impacted cerumen.     Left Ear: Tympanic membrane, ear canal and external ear normal. There is no impacted cerumen.     Nose: Nose normal. No congestion or rhinorrhea.     Mouth/Throat:     Lips: Pink.     Mouth: Mucous membranes are moist.     Pharynx: Oropharynx is clear. Uvula midline. No oropharyngeal exudate or posterior oropharyngeal erythema.  Eyes:     General: No  scleral icterus.       Right eye: No discharge.        Left eye: No discharge.     Extraocular Movements: Extraocular movements intact.     Conjunctiva/sclera: Conjunctivae normal.     Pupils: Pupils are equal, round, and reactive to light.  Neck:     Thyroid: No thyromegaly.     Vascular: No carotid bruit or JVD.     Trachea: No tracheal deviation.  Cardiovascular:     Rate and Rhythm: Normal rate and regular rhythm.     Pulses: Normal pulses.     Heart sounds: Normal heart sounds, S1 normal and S2 normal. No murmur heard.    No friction rub. No gallop.  Pulmonary:     Effort: Pulmonary effort is normal. No accessory muscle usage or  respiratory distress.     Breath sounds: Normal breath sounds. Decreased air movement present. No stridor. No wheezing or rales.  Chest:     Chest wall: No tenderness.  Abdominal:     General: Bowel sounds are normal. There is no distension.     Palpations: Abdomen is soft. There is no mass.     Tenderness: There is no abdominal tenderness. There is no guarding or rebound.  Musculoskeletal:        General: No tenderness or deformity. Normal range of motion.     Cervical back: Normal range of motion and neck supple.  Lymphadenopathy:     Cervical: No cervical adenopathy.  Skin:    General: Skin is warm and dry.     Capillary Refill: Capillary refill takes less than 2 seconds.     Coloration: Skin is not pale.     Findings: No erythema or rash.  Neurological:     Mental Status: He is alert and oriented to person, place, and time.     Cranial Nerves: No cranial nerve deficit.     Motor: No abnormal muscle tone.     Coordination: Coordination normal.     Gait: Gait normal.     Deep Tendon Reflexes: Reflexes are normal and symmetric.  Psychiatric:        Mood and Affect: Mood normal.        Behavior: Behavior normal. Behavior is cooperative.        Thought Content: Thought content normal.        Judgment: Judgment normal.         Assessment/Plan: 1. Encounter for routine adult health examination with abnormal findings (Primary) Age-appropriate preventive screenings and vaccinations discussed, annual physical exam completed. Routine labs for health maintenance ordered, see below. PHM updated.   - CBC with Differential/Platelet - CMP14+EGFR - Lipid Profile - Hgb A1C w/o eAG  2. Other emphysema (HCC) Continue trelegy as prescribed.  - Fluticasone-Umeclidin-Vilant (TRELEGY ELLIPTA ) 100-62.5-25 MCG/ACT AEPB; INHALE 1 PUFF INTO LUNGS ONCE DAILY  Dispense: 60 each; Refill: 11  3. Type 2 diabetes mellitus with hyperglycemia, without long-term current use of insulin (HCC) Continue mounjaro  as prescribed. Routine labs ordered  - CBC with Differential/Platelet - CMP14+EGFR - Lipid Profile - Hgb A1C w/o eAG - tirzepatide  (MOUNJARO ) 5 MG/0.5ML Pen; Inject 5 mg into the skin once a week.  Dispense: 6 mL; Refill: 1  4. Essential hypertension Routine labs ordered. BP is stable, patient stopped taking BP medication about a month ago. - CBC with Differential/Platelet - CMP14+EGFR - Lipid Profile - Hgb A1C w/o eAG  5. Mixed hyperlipidemia Continue atorvastatin  as prescribed. Routine labs ordered  - CBC with Differential/Platelet - CMP14+EGFR - Lipid Profile - Hgb A1C w/o eAG - atorvastatin  (LIPITOR) 20 MG tablet; Take 1 tablet (20 mg total) by mouth daily.  Dispense: 90 tablet; Refill: 3  6. Chronic bilateral low back pain with left-sided sciatica Will try meloxicam  as prescribed.  - meloxicam  (MOBIC ) 15 MG tablet; Take 1 tablet (15 mg total) by mouth daily. In am with breakfast  Dispense: 30 tablet; Refill: 3  7. Screening for colorectal cancer Cologuard test ordered  - Cologuard      General Counseling: Manu verbalizes understanding of the findings of todays visit and agrees with plan of treatment. I have discussed any further diagnostic evaluation that may be needed or ordered today. We also  reviewed his medications today. he has been encouraged to call the office with any questions  or concerns that should arise related to todays visit.    Orders Placed This Encounter  Procedures   Cologuard   CBC with Differential/Platelet   CMP14+EGFR   Lipid Profile   Hgb A1C w/o eAG    Meds ordered this encounter  Medications   Fluticasone-Umeclidin-Vilant (TRELEGY ELLIPTA ) 100-62.5-25 MCG/ACT AEPB    Sig: INHALE 1 PUFF INTO LUNGS ONCE DAILY    Dispense:  60 each    Refill:  11    For future refills   atorvastatin  (LIPITOR) 20 MG tablet    Sig: Take 1 tablet (20 mg total) by mouth daily.    Dispense:  90 tablet    Refill:  3   tirzepatide  (MOUNJARO ) 5 MG/0.5ML Pen    Sig: Inject 5 mg into the skin once a week.    Dispense:  6 mL    Refill:  1    Dx code E11.65; patient will start 5 mg dose after 4 weeks of the 2.5 mg dose   meloxicam  (MOBIC ) 15 MG tablet    Sig: Take 1 tablet (15 mg total) by mouth daily. 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   Total time spent:30 Minutes Time spent includes review of chart, medications, test results, and follow up plan with the patient.   [place] Controlled Substance Database was reviewed by me.  This patient was seen by Laurence Pons, FNP-C in collaboration with Dr. Verneta Gone as a part of collaborative care agreement.  Hitesh Fouche R. Bobbi Burow, MSN, FNP-C Internal medicine

## 2023-07-01 DIAGNOSIS — E782 Mixed hyperlipidemia: Secondary | ICD-10-CM | POA: Diagnosis not present

## 2023-07-01 DIAGNOSIS — E1165 Type 2 diabetes mellitus with hyperglycemia: Secondary | ICD-10-CM | POA: Diagnosis not present

## 2023-07-01 DIAGNOSIS — Z0001 Encounter for general adult medical examination with abnormal findings: Secondary | ICD-10-CM | POA: Diagnosis not present

## 2023-07-01 DIAGNOSIS — I10 Essential (primary) hypertension: Secondary | ICD-10-CM | POA: Diagnosis not present

## 2023-07-02 ENCOUNTER — Other Ambulatory Visit: Payer: Self-pay | Admitting: Nurse Practitioner

## 2023-07-02 LAB — CBC WITH DIFFERENTIAL/PLATELET
Basophils Absolute: 0.1 10*3/uL (ref 0.0–0.2)
Basos: 1 %
EOS (ABSOLUTE): 0.3 10*3/uL (ref 0.0–0.4)
Eos: 3 %
Hematocrit: 46.3 % (ref 37.5–51.0)
Hemoglobin: 15.5 g/dL (ref 13.0–17.7)
Immature Grans (Abs): 0 10*3/uL (ref 0.0–0.1)
Immature Granulocytes: 0 %
Lymphocytes Absolute: 2.8 10*3/uL (ref 0.7–3.1)
Lymphs: 30 %
MCH: 30.8 pg (ref 26.6–33.0)
MCHC: 33.5 g/dL (ref 31.5–35.7)
MCV: 92 fL (ref 79–97)
Monocytes Absolute: 0.6 10*3/uL (ref 0.1–0.9)
Monocytes: 6 %
Neutrophils Absolute: 5.6 10*3/uL (ref 1.4–7.0)
Neutrophils: 60 %
Platelets: 244 10*3/uL (ref 150–450)
RBC: 5.04 x10E6/uL (ref 4.14–5.80)
RDW: 14.4 % (ref 11.6–15.4)
WBC: 9.3 10*3/uL (ref 3.4–10.8)

## 2023-07-02 LAB — CMP14+EGFR
ALT: 20 IU/L (ref 0–44)
AST: 15 IU/L (ref 0–40)
Albumin: 4.1 g/dL (ref 3.9–4.9)
Alkaline Phosphatase: 120 IU/L (ref 44–121)
BUN/Creatinine Ratio: 16 (ref 10–24)
BUN: 12 mg/dL (ref 8–27)
Bilirubin Total: 0.3 mg/dL (ref 0.0–1.2)
CO2: 21 mmol/L (ref 20–29)
Calcium: 9.4 mg/dL (ref 8.6–10.2)
Chloride: 105 mmol/L (ref 96–106)
Creatinine, Ser: 0.75 mg/dL — ABNORMAL LOW (ref 0.76–1.27)
Globulin, Total: 2.3 g/dL (ref 1.5–4.5)
Glucose: 112 mg/dL — ABNORMAL HIGH (ref 70–99)
Potassium: 4.6 mmol/L (ref 3.5–5.2)
Sodium: 142 mmol/L (ref 134–144)
Total Protein: 6.4 g/dL (ref 6.0–8.5)
eGFR: 103 mL/min/{1.73_m2} (ref >59)

## 2023-07-02 LAB — LIPID PANEL
Chol/HDL Ratio: 2.9 ratio (ref 0.0–5.0)
Cholesterol, Total: 94 mg/dL — ABNORMAL LOW (ref 100–199)
HDL: 32 mg/dL — ABNORMAL LOW (ref >39)
LDL Chol Calc (NIH): 45 mg/dL (ref 0–99)
Triglycerides: 83 mg/dL (ref 0–149)
VLDL Cholesterol Cal: 17 mg/dL (ref 5–40)

## 2023-07-02 LAB — HGB A1C W/O EAG: Hgb A1c MFr Bld: 5.5 % (ref 4.8–5.6)

## 2023-07-06 DIAGNOSIS — Z1212 Encounter for screening for malignant neoplasm of rectum: Secondary | ICD-10-CM | POA: Diagnosis not present

## 2023-07-06 DIAGNOSIS — Z1211 Encounter for screening for malignant neoplasm of colon: Secondary | ICD-10-CM | POA: Diagnosis not present

## 2023-07-12 LAB — COLOGUARD: COLOGUARD: NEGATIVE

## 2023-07-20 ENCOUNTER — Telehealth (INDEPENDENT_AMBULATORY_CARE_PROVIDER_SITE_OTHER): Admitting: Nurse Practitioner

## 2023-07-20 ENCOUNTER — Encounter: Payer: Self-pay | Admitting: Nurse Practitioner

## 2023-07-20 VITALS — Ht 70.0 in | Wt 167.0 lb

## 2023-07-20 DIAGNOSIS — E1165 Type 2 diabetes mellitus with hyperglycemia: Secondary | ICD-10-CM | POA: Diagnosis not present

## 2023-07-20 DIAGNOSIS — J438 Other emphysema: Secondary | ICD-10-CM

## 2023-07-20 DIAGNOSIS — E782 Mixed hyperlipidemia: Secondary | ICD-10-CM | POA: Diagnosis not present

## 2023-07-20 NOTE — Progress Notes (Signed)
 [facility] [address] [place], [place] [address]  Internal MEDICINE  Telephone Visit  Patient Name: Kenneth Norman  [id number]  [id number]  Date of Service: 07/20/2023  I connected with the patient at 1145 by telephone and verified the patients identity using two identifiers.   I discussed the limitations, risks, security and privacy concerns of performing an evaluation and management service by telephone and the availability of in person appointments. I also discussed with the patient that there may be a patient responsible charge related to the service.  The patient expressed understanding and agrees to proceed.    Chief Complaint  Patient presents with   Telephone Screen   Telephone Assessment    Follow up for labs     HPI Dyan presents for a telehealth virtual visit for lab results.  diabetes -- A1c is significantly improved to normal range. Taking mounjaro  but he wants to stop taking it.  Slightly elevated glucose -- 112. This is controlled.  Alk phos improved to normal Cholesterol improved except HDL is low now.    Current Medication: Outpatient Encounter Medications as of 07/20/2023  Medication Sig   atorvastatin  (LIPITOR) 20 MG tablet Take 1 tablet (20 mg total) by mouth daily.   Fluticasone-Umeclidin-Vilant (TRELEGY ELLIPTA ) 100-62.5-25 MCG/ACT AEPB INHALE 1 PUFF INTO LUNGS ONCE DAILY   meloxicam  (MOBIC ) 15 MG tablet Take 1 tablet (15 mg total) by mouth daily. In am with breakfast   metFORMIN  (GLUCOPHAGE -XR) 500 MG 24 hr tablet Take 1 tablet by mouth once daily with breakfast   omeprazole  (PRILOSEC  OTC) 20 MG tablet Take 1 tablet (20 mg total) by mouth daily.   tirzepatide  (MOUNJARO ) 5 MG/0.5ML Pen Inject 5 mg into the skin once a week.   No facility-administered encounter medications on file as of 07/20/2023.    Surgical History: Past Surgical History:  Procedure Laterality Date   NASAL SEPTUM SURGERY     NASAL SINUS SURGERY     XI ROBOTIC ASSISTED  INGUINAL HERNIA REPAIR WITH MESH Left 02/14/2019   Procedure: XI ROBOTIC ASSISTED INGUINAL HERNIA REPAIR WITH MESH-possible bilateral;  Surgeon: Lane Shope, MD;  Location: [facility] ORS;  Service: General;  Laterality: Left;    Medical History: Past Medical History:  Diagnosis Date   Cancer (HCC)    skin cancer   COPD (chronic obstructive pulmonary disease) (HCC)    mild per pt   GERD (gastroesophageal reflux disease)    Hyperlipidemia    Hypertension    Pneumonia    resolved    Family History: Family History  Problem Relation Age of Onset   Cancer Mother    Heart disease Father     Social History   Socioeconomic History   Marital status: Married    Spouse name: Not on file   Number of children: Not on file   Years of education: Not on file   Highest education level: Not on file  Occupational History   Not on file  Tobacco Use   Smoking status: Every Day    Current packs/day: 1.50    Average packs/day: 1.5 packs/day for 40.0 years (60.0 ttl pk-yrs)    Types: Cigarettes   Smokeless tobacco: Never  Vaping Use   Vaping status: Never Used  Substance and Sexual Activity   Alcohol use: Not Currently    Comment: occasional   Drug use: Never   Sexual activity: Not on file  Other Topics Concern   Not on file  Social History Narrative  Not on file   Social Drivers of Health   Financial Resource Strain: Not on file  Food Insecurity: Not on file  Transportation Needs: Not on file  Physical Activity: Not on file  Stress: Not on file  Social Connections: Not on file  Intimate Partner Violence: Not on file      Review of Systems  Constitutional:  Negative for chills, fatigue and unexpected weight change.  HENT:  Negative for congestion, postnasal drip, rhinorrhea, sneezing and sore throat.   Eyes:  Negative for redness.  Respiratory: Negative.  Negative for cough, chest tightness, shortness of breath and wheezing.   Cardiovascular: Negative.  Negative for  chest pain and palpitations.  Gastrointestinal:  Negative for abdominal pain, constipation, diarrhea, nausea and vomiting.  Genitourinary: Negative.  Negative for dysuria and frequency.  Musculoskeletal:  Negative for arthralgias, back pain, joint swelling and neck pain.  Skin:  Negative for rash.  Neurological: Negative.  Negative for tremors and numbness.  Hematological:  Negative for adenopathy. Does not bruise/bleed easily.  Psychiatric/Behavioral:  Negative for behavioral problems (Depression), sleep disturbance and suicidal ideas. The patient is not nervous/anxious.     Vital Signs: Ht 5' 10 (1.778 m)   Wt 167 lb (75.8 kg)   BMI 23.96 kg/m    Observation/Objective: He is alert and oriented. No acute distress noted     Assessment/Plan: 1. Type 2 diabetes mellitus with hyperglycemia, without long-term current use of insulin (HCC) (Primary) Well controlled on mounjaro . A1c is normal. Follow up in November to repeat A1c.   2. Mixed hyperlipidemia Improved   3. Other emphysema (HCC) Breathing is improving some with increased daily exercise.    General Counseling: Ailton verbalizes understanding of the findings of today's phone visit and agrees with plan of treatment. I have discussed any further diagnostic evaluation that may be needed or ordered today. We also reviewed his medications today. he has been encouraged to call the office with any questions or concerns that should arise related to todays visit.  Return in about 5 months (around 12/05/2023) for F/U, Recheck A1C, Chantea Surace PCP.   No orders of the defined types were placed in this encounter.   No orders of the defined types were placed in this encounter.   Time spent:20 Minutes Time spent with patient included reviewing progress notes, labs, imaging studies, and discussing plan for follow up.  [place] Controlled Substance Database was reviewed by me for overdose risk score (ORS) if appropriate.  This patient was seen  by Mardy Maxin, FNP-C in collaboration with Dr. Sigrid Bathe as a part of collaborative care agreement.  Osvaldo Lamping R. Maxin, MSN, FNP-C Internal medicine

## 2023-08-02 ENCOUNTER — Other Ambulatory Visit: Payer: Self-pay | Admitting: Nurse Practitioner

## 2023-09-07 ENCOUNTER — Other Ambulatory Visit: Payer: Self-pay | Admitting: Nurse Practitioner

## 2023-10-06 ENCOUNTER — Other Ambulatory Visit: Payer: Self-pay | Admitting: Nurse Practitioner

## 2023-10-12 ENCOUNTER — Other Ambulatory Visit: Payer: Self-pay | Admitting: Nurse Practitioner

## 2023-10-12 DIAGNOSIS — G8929 Other chronic pain: Secondary | ICD-10-CM

## 2023-12-05 ENCOUNTER — Encounter: Payer: Self-pay | Admitting: Nurse Practitioner

## 2023-12-05 ENCOUNTER — Ambulatory Visit (INDEPENDENT_AMBULATORY_CARE_PROVIDER_SITE_OTHER): Admitting: Nurse Practitioner

## 2023-12-05 VITALS — BP 132/86 | HR 83 | Temp 98.1°F | Resp 16 | Ht 70.0 in | Wt 154.8 lb

## 2023-12-05 DIAGNOSIS — E1165 Type 2 diabetes mellitus with hyperglycemia: Secondary | ICD-10-CM

## 2023-12-05 DIAGNOSIS — G8929 Other chronic pain: Secondary | ICD-10-CM

## 2023-12-05 DIAGNOSIS — E785 Hyperlipidemia, unspecified: Secondary | ICD-10-CM

## 2023-12-05 DIAGNOSIS — E1169 Type 2 diabetes mellitus with other specified complication: Secondary | ICD-10-CM | POA: Diagnosis not present

## 2023-12-05 DIAGNOSIS — M5442 Lumbago with sciatica, left side: Secondary | ICD-10-CM

## 2023-12-05 DIAGNOSIS — K219 Gastro-esophageal reflux disease without esophagitis: Secondary | ICD-10-CM | POA: Diagnosis not present

## 2023-12-05 LAB — POCT GLYCOSYLATED HEMOGLOBIN (HGB A1C): Hemoglobin A1C: 5.3 % (ref 4.0–5.6)

## 2023-12-05 MED ORDER — TIRZEPATIDE 5 MG/0.5ML ~~LOC~~ SOAJ: 5.0000 mg | SUBCUTANEOUS | 1 refills | Status: AC

## 2023-12-05 NOTE — Patient Instructions (Signed)
 STOP metformin   CONTINUE mounjaro 

## 2023-12-05 NOTE — Progress Notes (Signed)
 [facility] [address] [place], [place] [address]  Internal MEDICINE  Office Visit Note  Patient Name: Kenneth Norman  [id number]  [id number]  Date of Service: 12/05/2023  Chief Complaint  Patient presents with   Gastroesophageal Reflux   Hypertension   Hyperlipidemia   Follow-up    HPI Kaipo presents for a follow-up visit for diabetes, high cholesterol, GERD and refills.  Diabetes -- A1c remains well controlled in normal range at 5.3 today. Has been taking metformin  and mounjaro .  High cholesterol -- takes atorvastatin  daily.  Getting into a new hobby, enjoying golfing and goes with his wife as often as he can.  GERD -- takes OTC omeprazole  as needed.     Current Medication: Outpatient Encounter Medications as of 12/05/2023  Medication Sig   atorvastatin  (LIPITOR) 20 MG tablet Take 1 tablet (20 mg total) by mouth daily.   Fluticasone-Umeclidin-Vilant (TRELEGY ELLIPTA ) 100-62.5-25 MCG/ACT AEPB INHALE 1 PUFF INTO LUNGS ONCE DAILY   omeprazole  (PRILOSEC  OTC) 20 MG tablet Take 1 tablet (20 mg total) by mouth daily.   tirzepatide  (MOUNJARO ) 5 MG/0.5ML Pen Inject 5 mg into the skin once a week.   [DISCONTINUED] meloxicam  (MOBIC ) 15 MG tablet TAKE 1 TABLET BY MOUTH ONCE DAILY IN THE MORNING WITH BREAKFAST   [DISCONTINUED] metFORMIN  (GLUCOPHAGE -XR) 500 MG 24 hr tablet Take 1 tablet by mouth once daily with breakfast   [DISCONTINUED] tirzepatide  (MOUNJARO ) 5 MG/0.5ML Pen Inject 5 mg into the skin once a week.   No facility-administered encounter medications on file as of 12/05/2023.    Surgical History: Past Surgical History:  Procedure Laterality Date   NASAL SEPTUM SURGERY     NASAL SINUS SURGERY     XI ROBOTIC ASSISTED INGUINAL HERNIA REPAIR WITH MESH Left 02/14/2019   Procedure: XI ROBOTIC ASSISTED INGUINAL HERNIA REPAIR WITH MESH-possible bilateral;  Surgeon: Lane Shope, MD;  Location: [facility] ORS;  Service: General;  Laterality: Left;    Medical  History: Past Medical History:  Diagnosis Date   Cancer (HCC)    skin cancer   COPD (chronic obstructive pulmonary disease) (HCC)    mild per pt   GERD (gastroesophageal reflux disease)    Hyperlipidemia    Hypertension    Pneumonia    resolved    Family History: Family History  Problem Relation Age of Onset   Cancer Mother    Heart disease Father     Social History   Socioeconomic History   Marital status: Married    Spouse name: Not on file   Number of children: Not on file   Years of education: Not on file   Highest education level: Not on file  Occupational History   Not on file  Tobacco Use   Smoking status: Every Day    Current packs/day: 1.50    Average packs/day: 1.5 packs/day for 40.0 years (60.0 ttl pk-yrs)    Types: Cigarettes   Smokeless tobacco: Never  Vaping Use   Vaping status: Never Used  Substance and Sexual Activity   Alcohol use: Not Currently    Comment: occasional   Drug use: Never   Sexual activity: Not on file  Other Topics Concern   Not on file  Social History Narrative   Not on file   Social Drivers of Health   Financial Resource Strain: Not on file  Food Insecurity: Not on file  Transportation Needs: Not on file  Physical Activity: Not on file  Stress: Not on file  Social Connections:  Not on file  Intimate Partner Violence: Not on file      Review of Systems  Constitutional:  Negative for chills, fatigue and unexpected weight change.  HENT:  Negative for congestion, postnasal drip, rhinorrhea, sneezing and sore throat.   Eyes:  Negative for redness.  Respiratory: Negative.  Negative for cough, chest tightness, shortness of breath and wheezing.   Cardiovascular: Negative.  Negative for chest pain and palpitations.  Gastrointestinal:  Negative for abdominal pain, constipation, diarrhea, nausea and vomiting.  Genitourinary:  Negative for dysuria and frequency.  Musculoskeletal:  Negative for arthralgias, back pain, joint  swelling and neck pain.  Skin:  Negative for rash.  Neurological: Negative.  Negative for tremors and numbness.  Hematological:  Negative for adenopathy. Does not bruise/bleed easily.  Psychiatric/Behavioral:  Negative for behavioral problems (Depression), sleep disturbance and suicidal ideas. The patient is not nervous/anxious.     Vital Signs: BP 132/86   Pulse 83   Temp 98.1 F (36.7 C)   Resp 16   Ht 5' 10 (1.778 m)   Wt 154 lb 12.8 oz (70.2 kg)   SpO2 96%   BMI 22.21 kg/m    Physical Exam Vitals reviewed.  Constitutional:      General: He is not in acute distress.    Appearance: Normal appearance. He is not ill-appearing.  HENT:     Head: Normocephalic and atraumatic.  Eyes:     Pupils: Pupils are equal, round, and reactive to light.  Cardiovascular:     Rate and Rhythm: Normal rate and regular rhythm.  Pulmonary:     Effort: Pulmonary effort is normal. No respiratory distress.  Neurological:     Mental Status: He is alert and oriented to person, place, and time.  Psychiatric:        Mood and Affect: Mood normal.        Behavior: Behavior normal.        Assessment/Plan: 1. Type 2 diabetes mellitus with hyperglycemia, without long-term current use of insulin (HCC) (Primary) Continue mounjaro  as prescribed. Urine sent to lab for microalbumin/creatinine ratio. A1c is stable and in normal range. Metformin  discontinued.  - Urine Microalbumin w/creat. ratio - POCT glycosylated hemoglobin (Hb A1C) - tirzepatide  (MOUNJARO ) 5 MG/0.5ML Pen; Inject 5 mg into the skin once a week.  Dispense: 6 mL; Refill: 1  2. Hyperlipidemia associated with type 2 diabetes mellitus (HCC) Continue atorvastatin  as prescribed.   3. Gastroesophageal reflux disease without esophagitis Continue omeprazole  OTC as directed   4. Chronic bilateral low back pain with left-sided sciatica Doing much better. Does not need meloxicam . Takes OTC tylenol  occasionally    General Counseling: Lorrie  verbalizes understanding of the findings of todays visit and agrees with plan of treatment. I have discussed any further diagnostic evaluation that may be needed or ordered today. We also reviewed his medications today. he has been encouraged to call the office with any questions or concerns that should arise related to todays visit.    Orders Placed This Encounter  Procedures   Urine Microalbumin w/creat. ratio   POCT glycosylated hemoglobin (Hb A1C)    Meds ordered this encounter  Medications   tirzepatide  (MOUNJARO ) 5 MG/0.5ML Pen    Sig: Inject 5 mg into the skin once a week.    Dispense:  6 mL    Refill:  1    Dx code E11.65; patient will start 5 mg dose after 4 weeks of the 2.5 mg dose    Return for  CPE, Ethaniel Garfield PCP , previously scheduled in june next year. .   Total time spent:30 Minutes Time spent includes review of chart, medications, test results, and follow up plan with the patient.   [place] Controlled Substance Database was reviewed by me.  This patient was seen by Mardy Maxin, FNP-C in collaboration with Dr. Sigrid Bathe as a part of collaborative care agreement.   Koren Plyler R. Maxin, MSN, FNP-C Internal medicine

## 2023-12-06 LAB — MICROALBUMIN / CREATININE URINE RATIO
Creatinine, Urine: 22.7 mg/dL
Microalb/Creat Ratio: <13 mg/g{creat} (ref 0–29)
Microalbumin, Urine: <3 ug/mL

## 2023-12-27 ENCOUNTER — Encounter: Payer: Self-pay | Admitting: Nurse Practitioner

## 2024-07-02 ENCOUNTER — Encounter: Admitting: Nurse Practitioner
